# Patient Record
Sex: Female | Born: 1950
Health system: Southern US, Community
[De-identification: ages and names within clinical notes are randomized; demographics above are authoritative.]

## PROBLEM LIST (undated history)

## (undated) DIAGNOSIS — F419 Anxiety disorder, unspecified: Secondary | ICD-10-CM

## (undated) DIAGNOSIS — M1990 Unspecified osteoarthritis, unspecified site: Secondary | ICD-10-CM

## (undated) DIAGNOSIS — T8859XA Other complications of anesthesia, initial encounter: Secondary | ICD-10-CM

## (undated) DIAGNOSIS — F329 Major depressive disorder, single episode, unspecified: Secondary | ICD-10-CM

## (undated) DIAGNOSIS — R12 Heartburn: Secondary | ICD-10-CM

## (undated) DIAGNOSIS — D649 Anemia, unspecified: Secondary | ICD-10-CM

## (undated) DIAGNOSIS — K219 Gastro-esophageal reflux disease without esophagitis: Secondary | ICD-10-CM

## (undated) DIAGNOSIS — F32A Depression, unspecified: Secondary | ICD-10-CM

## (undated) DIAGNOSIS — C801 Malignant (primary) neoplasm, unspecified: Secondary | ICD-10-CM

## (undated) DIAGNOSIS — F988 Other specified behavioral and emotional disorders with onset usually occurring in childhood and adolescence: Secondary | ICD-10-CM

## (undated) DIAGNOSIS — E039 Hypothyroidism, unspecified: Secondary | ICD-10-CM

## (undated) HISTORY — DX: Hypothyroidism, unspecified: E03.9

## (undated) HISTORY — PX: VARICOSE VEIN SURGERY: SHX832

## (undated) HISTORY — DX: Other specified behavioral and emotional disorders with onset usually occurring in childhood and adolescence: F98.8

## (undated) HISTORY — PX: OTHER SURGICAL HISTORY: SHX169

---

## 2007-03-03 ENCOUNTER — Other Ambulatory Visit: Admission: RE | Admit: 2007-03-03 | Discharge: 2007-03-03 | Payer: Self-pay | Admitting: Obstetrics and Gynecology

## 2007-03-31 ENCOUNTER — Encounter: Admission: RE | Admit: 2007-03-31 | Discharge: 2007-03-31 | Payer: Self-pay | Admitting: Obstetrics and Gynecology

## 2007-04-06 ENCOUNTER — Ambulatory Visit: Payer: Self-pay | Admitting: Internal Medicine

## 2007-04-20 ENCOUNTER — Encounter: Payer: Self-pay | Admitting: Internal Medicine

## 2007-04-20 ENCOUNTER — Ambulatory Visit: Payer: Self-pay | Admitting: Internal Medicine

## 2008-03-27 ENCOUNTER — Other Ambulatory Visit: Admission: RE | Admit: 2008-03-27 | Discharge: 2008-03-27 | Payer: Self-pay | Admitting: Obstetrics and Gynecology

## 2009-04-06 ENCOUNTER — Encounter: Admission: RE | Admit: 2009-04-06 | Discharge: 2009-04-06 | Payer: Self-pay | Admitting: Obstetrics and Gynecology

## 2009-12-08 HISTORY — PX: KNEE ARTHROSCOPY: SUR90

## 2010-07-23 ENCOUNTER — Encounter: Admission: RE | Admit: 2010-07-23 | Discharge: 2010-07-23 | Payer: Self-pay | Admitting: Obstetrics and Gynecology

## 2011-08-20 ENCOUNTER — Other Ambulatory Visit: Payer: Self-pay | Admitting: Obstetrics and Gynecology

## 2011-08-20 DIAGNOSIS — Z1231 Encounter for screening mammogram for malignant neoplasm of breast: Secondary | ICD-10-CM

## 2011-08-25 ENCOUNTER — Ambulatory Visit
Admission: RE | Admit: 2011-08-25 | Discharge: 2011-08-25 | Disposition: A | Discharge status: Home / Self Care | Payer: PRIVATE HEALTH INSURANCE | Source: Ambulatory Visit | Attending: Obstetrics and Gynecology | Admitting: Obstetrics and Gynecology

## 2011-08-25 DIAGNOSIS — Z1231 Encounter for screening mammogram for malignant neoplasm of breast: Secondary | ICD-10-CM

## 2012-09-23 ENCOUNTER — Other Ambulatory Visit (INDEPENDENT_AMBULATORY_CARE_PROVIDER_SITE_OTHER): Payer: PRIVATE HEALTH INSURANCE

## 2012-09-23 DIAGNOSIS — Z23 Encounter for immunization: Secondary | ICD-10-CM

## 2013-06-09 ENCOUNTER — Ambulatory Visit (INDEPENDENT_AMBULATORY_CARE_PROVIDER_SITE_OTHER): Payer: BC Managed Care – PPO | Admitting: Family Medicine

## 2013-06-09 ENCOUNTER — Ambulatory Visit: Payer: Self-pay | Admitting: Family Medicine

## 2013-06-09 ENCOUNTER — Encounter: Payer: Self-pay | Admitting: Family Medicine

## 2013-06-09 VITALS — BP 94/60 | HR 80 | Temp 102.5°F | Ht 65.0 in | Wt 193.0 lb

## 2013-06-09 DIAGNOSIS — N390 Urinary tract infection, site not specified: Secondary | ICD-10-CM

## 2013-06-09 DIAGNOSIS — R6883 Chills (without fever): Secondary | ICD-10-CM

## 2013-06-09 DIAGNOSIS — R52 Pain, unspecified: Secondary | ICD-10-CM

## 2013-06-09 DIAGNOSIS — R509 Fever, unspecified: Secondary | ICD-10-CM

## 2013-06-09 LAB — POCT URINALYSIS DIPSTICK
Bilirubin, UA: NEGATIVE
Nitrite, UA: NEGATIVE
Spec Grav, UA: 1.01
pH, UA: 7

## 2013-06-09 MED ORDER — CIPROFLOXACIN HCL 500 MG PO TABS: 500.0000 mg | ORAL_TABLET | Freq: Two times a day (BID) | ORAL | Status: DC

## 2013-06-09 NOTE — Progress Notes (Signed)
Chief Complaint  Patient presents with  . Chills    and fever, joint paint that started yesterday.    Patient had acute onset of fever, generalized body aches, "my eyeballs hurt", "roots of my hair" hurt, starting yesterday.  She had chills, fever, decreased appetite. She denies other symptoms, although does reports using the bathroom a little more frequently.  Denies dysuria, urgency, urinary odor, hematuria.   She has been getting vein treatments--laser treatment 1 month ago, and injections last about 2 weeks ago.  Denies any known redness or pain.  Denies any tick bites  Past Medical History  Diagnosis Date  . Hypothyroidism   . ADD (attention deficit disorder)    History reviewed. No pertinent past surgical history. History   Social History  . Marital Status: Married    Spouse Name: N/A    Number of Children: N/A  . Years of Education: N/A   Occupational History  . Not on file.   Social History Main Topics  . Smoking status: Former Smoker    Quit date: 12/08/1990  . Smokeless tobacco: Not on file  . Alcohol Use: Yes     Comment: 1 week  . Drug Use: Not on file  . Sexually Active: Not on file   Other Topics Concern  . Not on file   Social History Narrative   Married.  Retired from [employer], now working at [employer]  Has 1 son in [place] (works for [employer]), daughter Selena Batten leaves in [place].  Originally from [place]   Family History  Problem Relation Age of Onset  . Arthritis Mother    Current outpatient prescriptions:amphetamine-dextroamphetamine (ADDERALL XR) 25 MG 24 hr capsule, Take 25 mg by mouth every morning., Disp: , Rfl: ;  calcium carbonate (OS-CAL) 600 MG TABS, Take 600 mg by mouth 2 (two) times daily with a meal., Disp: , Rfl: ;  cholecalciferol (VITAMIN D) 1000 UNITS tablet, Take 1,000 Units by mouth daily., Disp: , Rfl:  levothyroxine (SYNTHROID, LEVOTHROID) 25 MCG tablet, Take 25 mcg by mouth daily before breakfast., Disp: , Rfl: ;  Multiple  Vitamins-Minerals (MULTIVITAMIN WITH MINERALS) tablet, Take 1 tablet by mouth daily., Disp: , Rfl:   No Known Allergies  ROS:  Denies nausea, vomiting, diarrhea, URI symptoms or cough, no runny nose/congestion, ear pain.  +diffuse headache (mainly frontal and temples).  Denies abdominal pain, chest pain, shortness of breath, flank pain. No sick contacts. Denies skin rash, bleeding or other complaints except as per HPI  PHYSICAL EXAM: BP 94/60  Pulse 80  Temp(Src) 102.5 F (39.2 C) (Oral)  Ht 5\' 5"  (1.651 m)  Wt 193 lb (87.544 kg)  BMI 32.12 kg/m2 Mildly ill appearing female in no distress HEENT:  PERRL, EOMI, conjunctiva clear.  TM's and EACs normal.  OP clear without erythema.  Sinuses nontender.  No temporal artery tenderness. Neck: no lymphadenopathy, thyromegaly or mass Heart: 2/6 SEM loudest at RUSB, hyperdynamic, pulse 92 Lungs: clear bilaterally Abdomen: soft, nontender, no organomegaly or mass, no suprapubic tenderness Back: no spine or CVA tenderness Skin: R thigh--medially there is a slightly firm vein with slight bruising.  No erythema, warmth or fluctuance (area of recent injection). Extremities: no edema Neuro: alert and oriented, normal gait, strength, sensation Psych: normal mood, affect, hygiene and grooming  Urine 2+ leuks, 2+ blood  ASSESSMENT/PLAN:  Chills - Plan: Influenza A/B, POCT Urinalysis Dipstick  Body aches - Plan: Influenza A/B, POCT Urinalysis Dipstick  Fever - Plan: Influenza A/B, POCT Urinalysis  Dipstick, Urine culture  Urinary tract infection, site not specified - Plan: Urine culture, ciprofloxacin (CIPRO) 500 MG tablet

## 2013-06-09 NOTE — Patient Instructions (Addendum)
Drink plenty of fluids.  Take antibiotics twice daily for a week. Use tylenol and/or ibuprofen for control of fever.  You should feel significantly better after 48 hours of antibiotics.  Call us Monday if you are still feeling bad.  We should have culture results in about 3 days.

## 2013-06-12 LAB — URINE CULTURE

## 2013-06-13 ENCOUNTER — Encounter: Payer: Self-pay | Admitting: Family Medicine

## 2013-06-13 ENCOUNTER — Other Ambulatory Visit: Payer: Self-pay

## 2013-06-13 NOTE — Telephone Encounter (Signed)
UPDATED MED LIST PER PT

## 2013-08-17 ENCOUNTER — Other Ambulatory Visit (INDEPENDENT_AMBULATORY_CARE_PROVIDER_SITE_OTHER): Payer: BC Managed Care – PPO

## 2013-08-17 DIAGNOSIS — Z23 Encounter for immunization: Secondary | ICD-10-CM

## 2013-11-07 ENCOUNTER — Ambulatory Visit (INDEPENDENT_AMBULATORY_CARE_PROVIDER_SITE_OTHER): Payer: BC Managed Care – PPO | Admitting: Podiatry

## 2013-11-07 ENCOUNTER — Encounter: Payer: Self-pay | Admitting: Podiatry

## 2013-11-07 VITALS — BP 107/77 | HR 70 | Resp 16

## 2013-11-07 DIAGNOSIS — M2010 Hallux valgus (acquired), unspecified foot: Secondary | ICD-10-CM

## 2013-11-07 DIAGNOSIS — M2040 Other hammer toe(s) (acquired), unspecified foot: Secondary | ICD-10-CM

## 2013-11-07 DIAGNOSIS — M779 Enthesopathy, unspecified: Secondary | ICD-10-CM

## 2013-11-07 NOTE — Progress Notes (Signed)
Subjective:     Patient ID: Sarah James, female   DOB: December 09, 1950, 63 y.o.   MRN: [id number]  HPI patient presents to pick up orthotics and states I want to get my feet fixed but I think I need to wait till January because I am going on a cruise at the end of the month. Patient has structural bunion deformity hammertoe deformity of the lesser digits and Taylor's bunion deformity left over right foot   Review of Systems     Objective:   Physical Exam    neurovascular status intact with structural for deformity of both feet with bunions hammertoes and tailor bunion deformity noted Assessment:    and Taylor's bunion deformity noted left over right foot. As forefoot inflammation and toes are elongated Chronic structural malalignment of the forefeet bilateral with capsulitis and inflammation    Plan:     Reviewed conditions and discussed correction. I did discuss that when fixing the toes we would shorten them which would make him more stable and more comfortable in shoe gear and reduce pressure against the metatarsal joints. Patient wants surgery but needs to wait to beginning of January and we will have her back third week of December to discuss in greater detail what will be necessary. Orthotics dispensed today

## 2013-11-07 NOTE — Patient Instructions (Signed)

## 2013-12-19 ENCOUNTER — Ambulatory Visit: Payer: BC Managed Care – PPO | Admitting: Podiatry

## 2014-02-23 ENCOUNTER — Encounter: Payer: Self-pay | Admitting: Internal Medicine

## 2014-12-12 ENCOUNTER — Other Ambulatory Visit: Payer: Self-pay

## 2014-12-12 DIAGNOSIS — Z1231 Encounter for screening mammogram for malignant neoplasm of breast: Secondary | ICD-10-CM

## 2014-12-13 ENCOUNTER — Telehealth: Payer: Self-pay | Admitting: Certified Nurse Midwife

## 2014-12-13 NOTE — Telephone Encounter (Signed)
Spoke with patient. Advised patient of message as seen below from Sarah James. Patient is agreeable and would like to move aex appointment up at this time. Aex moved to 1/18 at 2pm with Regina Eck CNM. Patient is agreeable to date and time.  Cc: Dr.Silva  Routing to provider for final review. Patient agreeable to disposition. Will close encounter

## 2014-12-13 NOTE — Telephone Encounter (Signed)
Patient has her next aex appointment 01/25/15 with Sarah James. Patient was last seen 09/16/12 and is wondering is Sarah James would send an order for her MMG. Patient called the [facility] center and was told she would need to see her GYN because it had been so long since her last MMG.

## 2014-12-13 NOTE — Telephone Encounter (Signed)
[facility] was to have routine screening in one year. Results available in EPIC. Patient was last seen for aex in our office 09/16/12. Has annual scheduled for 01/25/15. Spoke with the [facility] regarding scheduling for screening mammogram and was advised will need to see GYN for order. Patient would like to know if order can be placed before aex so that she can schedule screening.   Dr.Silva please advise as Regina Eck CNM and Milford Cage, FNP are out of the office today.

## 2014-12-13 NOTE — Telephone Encounter (Signed)
I recommend that the patient is seen first for her annual exam. She has not been examined by our office in over 2 years.  If there is an opening for an annual exam sooner, this may expedite the mammogram.

## 2014-12-25 ENCOUNTER — Ambulatory Visit (INDEPENDENT_AMBULATORY_CARE_PROVIDER_SITE_OTHER): Payer: BLUE CROSS/BLUE SHIELD | Admitting: Certified Nurse Midwife

## 2014-12-25 ENCOUNTER — Encounter: Payer: Self-pay | Admitting: Certified Nurse Midwife

## 2014-12-25 VITALS — BP 102/64 | HR 68 | Resp 16 | Ht 64.25 in | Wt 195.0 lb

## 2014-12-25 DIAGNOSIS — Z01419 Encounter for gynecological examination (general) (routine) without abnormal findings: Secondary | ICD-10-CM

## 2014-12-25 DIAGNOSIS — Z124 Encounter for screening for malignant neoplasm of cervix: Secondary | ICD-10-CM

## 2014-12-25 NOTE — Patient Instructions (Signed)

## 2014-12-25 NOTE — Progress Notes (Signed)
64 y.o. G26P2004 Married  Caucasian Fe here for annual exam. Menopausal no HRT. Patient denies vaginal bleeding or vaginal dryness.  Busy caring for 32 year old mother, so has not kept up with exam. Denies any problems, has appointment scheduled with PCP and has all labs with spouse's work. Went to vein clinic and had varicosities treated, feels so much better. Appreciative to being discussed at last visit. No other health issues today.  Patient's last menstrual period was 12/08/2002.          Sexually active: Yes.    The current method of family planning is post menopausal status.    Exercising: Yes.    tennis Smoker:  no  Health Maintenance: Pap: 09-16-12 neg HPV neg MMG:  08-20-11 birads 1:neg Colonoscopy:  2008 BMD:   2010 TDaP:  2014 Labs: none Self breast exam: done occ   reports that she quit smoking about 24 years ago. She does not have any smokeless tobacco history on file. She reports that she drinks alcohol. She reports that she does not use illicit drugs.  Past Medical History  Diagnosis Date  . Hypothyroidism   . ADD (attention deficit disorder)     Past Surgical History  Procedure Laterality Date  . Deviated septum repair    . Varicose vein surgery    . Knee arthroscopy Right 2011    Current Outpatient Prescriptions  Medication Sig Dispense Refill  . amphetamine-dextroamphetamine (ADDERALL XR) 25 MG 24 hr capsule Take 25 mg by mouth every morning.    . calcium carbonate (OS-CAL) 600 MG TABS Take 600 mg by mouth daily.     Marland Kitchen FLUoxetine (PROZAC) 10 MG capsule Take 10 mg by mouth daily.    Marland Kitchen levothyroxine (SYNTHROID, LEVOTHROID) 88 MCG tablet Take 88 mcg by mouth daily before breakfast.    . Multiple Vitamins-Minerals (MULTIVITAMIN WITH MINERALS) tablet Take 1 tablet by mouth daily.     No current facility-administered medications for this visit.    Family History  Problem Relation Age of Onset  . Arthritis Mother     ROS:  Pertinent items are noted in HPI.   Otherwise, a comprehensive ROS was negative.  Exam:   BP 102/64 mmHg  Pulse 68  Resp 16  Ht 5' 4.25" (1.632 m)  Wt 195 lb (88.451 kg)  BMI 33.21 kg/m2  LMP 12/08/2002 Height: 5' 4.25" (163.2 cm) Ht Readings from Last 3 Encounters:  12/25/14 5' 4.25" (1.632 m)  06/09/13 5\' 5"  (1.651 m)    General appearance: alert, cooperative and appears stated age Head: Normocephalic, without obvious abnormality, atraumatic Neck: no adenopathy, supple, symmetrical, trachea midline and thyroid normal to inspection and palpation Lungs: clear to auscultation bilaterally Breasts: normal appearance, no masses or tenderness, No nipple retraction or dimpling, No nipple discharge or bleeding, No axillary or supraclavicular adenopathy Heart: regular rate and rhythm Abdomen: soft, non-tender; no masses,  no organomegaly Extremities: extremities normal, atraumatic, no cyanosis or edema Skin: Skin color, texture, turgor normal. No rashes or lesions Lymph nodes: Cervical, supraclavicular, and axillary nodes normal. No abnormal inguinal nodes palpated Neurologic: Grossly normal   Pelvic: External genitalia:  no lesions              Urethra:  normal appearing urethra with no masses, tenderness or lesions              Bartholin's and Skene's: normal                 Vagina: normal  appearing vagina with normal color and discharge, no lesions              Cervix: normal appearance,  non tender, no lesions              Pap taken: Yes.   Bimanual Exam:  Uterus:  normal size, contour, position, consistency, mobility, non-tender              Adnexa: normal adnexa               Rectovaginal: Confirms               Anus:  normal sphincter tone, no lesions  Chaperone present: /NO  A:  Well Woman with normal exam   Menopausal no HRT  Caregiver for mother with social stress  Mammogram and BMD due patient plans to schedule  P:   Reviewed health and wellness pertinent to exam  Aware of need to evaluate if vaginal  bleeding  Discussed family and friend support and time for self  Pap smear taken today with HPV reflex   counseled on breast self exam, mammography screening, menopause, adequate intake of calcium and vitamin D, diet and exercise  return annually or prn  An After Visit Summary was printed and given to the patient.

## 2014-12-26 ENCOUNTER — Other Ambulatory Visit: Payer: Self-pay | Admitting: Certified Nurse Midwife

## 2014-12-26 ENCOUNTER — Other Ambulatory Visit: Payer: Self-pay

## 2014-12-26 DIAGNOSIS — Z1231 Encounter for screening mammogram for malignant neoplasm of breast: Secondary | ICD-10-CM

## 2014-12-26 NOTE — Progress Notes (Signed)
Reviewed personally.  M. Suzanne Artia Singley, MD.  

## 2014-12-27 ENCOUNTER — Telehealth: Payer: Self-pay | Admitting: Certified Nurse Midwife

## 2014-12-27 DIAGNOSIS — Z780 Asymptomatic menopausal state: Secondary | ICD-10-CM

## 2014-12-27 DIAGNOSIS — E2839 Other primary ovarian failure: Secondary | ICD-10-CM

## 2014-12-27 LAB — IPS PAP TEST WITH REFLEX TO HPV

## 2014-12-27 NOTE — Telephone Encounter (Signed)
Pt tried to make appointment for her mammogram and bone density at [facility]. Pt states she wants to make them for the same day and is not able to unless she has the order sent over for the bone density. [facility] said we can send electronically. Pt is anxious to make appointments asap.  Pt requests call when orders sent.

## 2014-12-27 NOTE — Telephone Encounter (Signed)
Spoke with patient. Advised patient order for BMD has been sent over to [facility]. Patient is agreeable and will call to schedule appointment at this time. Patient is very grateful for all of our help.  Routing to provider for final review. Patient agreeable to disposition. Will close encounter

## 2015-01-02 ENCOUNTER — Emergency Department (HOSPITAL_COMMUNITY)
Admission: EM | Admit: 2015-01-02 | Discharge: 2015-01-02 | Disposition: A | Discharge status: Home / Self Care | Payer: BLUE CROSS/BLUE SHIELD | Attending: Emergency Medicine | Admitting: Emergency Medicine

## 2015-01-02 ENCOUNTER — Emergency Department (HOSPITAL_COMMUNITY): Payer: BLUE CROSS/BLUE SHIELD

## 2015-01-02 ENCOUNTER — Encounter (HOSPITAL_COMMUNITY): Payer: Self-pay | Admitting: Emergency Medicine

## 2015-01-02 DIAGNOSIS — Z87891 Personal history of nicotine dependence: Secondary | ICD-10-CM | POA: Diagnosis not present

## 2015-01-02 DIAGNOSIS — Y9389 Activity, other specified: Secondary | ICD-10-CM | POA: Diagnosis not present

## 2015-01-02 DIAGNOSIS — Z79899 Other long term (current) drug therapy: Secondary | ICD-10-CM | POA: Insufficient documentation

## 2015-01-02 DIAGNOSIS — Y9289 Other specified places as the place of occurrence of the external cause: Secondary | ICD-10-CM | POA: Diagnosis not present

## 2015-01-02 DIAGNOSIS — W01198A Fall on same level from slipping, tripping and stumbling with subsequent striking against other object, initial encounter: Secondary | ICD-10-CM | POA: Insufficient documentation

## 2015-01-02 DIAGNOSIS — S0990XA Unspecified injury of head, initial encounter: Secondary | ICD-10-CM | POA: Insufficient documentation

## 2015-01-02 DIAGNOSIS — Z8659 Personal history of other mental and behavioral disorders: Secondary | ICD-10-CM | POA: Diagnosis not present

## 2015-01-02 DIAGNOSIS — Y998 Other external cause status: Secondary | ICD-10-CM | POA: Insufficient documentation

## 2015-01-02 DIAGNOSIS — E039 Hypothyroidism, unspecified: Secondary | ICD-10-CM | POA: Insufficient documentation

## 2015-01-02 DIAGNOSIS — W19XXXA Unspecified fall, initial encounter: Secondary | ICD-10-CM

## 2015-01-02 DIAGNOSIS — S0003XA Contusion of scalp, initial encounter: Secondary | ICD-10-CM | POA: Diagnosis not present

## 2015-01-02 MED ORDER — HYDROCODONE-ACETAMINOPHEN 5-325 MG PO TABS: 1.0000 | ORAL_TABLET | Freq: Four times a day (QID) | ORAL | Status: DC | PRN

## 2015-01-02 NOTE — ED Provider Notes (Signed)
CSN: [id number]     Arrival date & time 01/02/15  1345 History   First MD Initiated Contact with Patient 01/02/15 1605     Chief Complaint  Patient presents with  . Fall     (Consider location/radiation/quality/duration/timing/severity/associated sxs/prior Treatment) Patient is a 64 y.o. female presenting with fall. The history is provided by the patient.  Fall This is a new problem. Episode onset: 2-3 hrs ago. Episode frequency: once. The problem has been resolved. Pertinent negatives include no chest pain, no abdominal pain, no headaches and no shortness of breath. Nothing aggravates the symptoms. The symptoms are relieved by medications. Treatments tried: took 2 advil prior to fall. The treatment provided moderate relief.    Past Medical History  Diagnosis Date  . Hypothyroidism   . ADD (attention deficit disorder)    Past Surgical History  Procedure Laterality Date  . Deviated septum repair    . Varicose vein surgery    . Knee arthroscopy Right 2011   Family History  Problem Relation Age of Onset  . Arthritis Mother   . Hypertension Mother   . Lung cancer Father   . Stroke Maternal Grandmother   . Hypertension Maternal Grandmother   . Heart disease Paternal Grandfather    History  Substance Use Topics  . Smoking status: Former Smoker    Quit date: 12/08/1990  . Smokeless tobacco: Not on file  . Alcohol Use: 0.0 oz/week    0 Not specified per week     Comment: 1 or 2 a month   OB History    Gravida Para Term Preterm AB TAB SAB Ectopic Multiple Living   2 2 2       4      Review of Systems  Constitutional: Negative for fever and fatigue.  HENT: Negative for congestion and drooling.   Eyes: Negative for pain.  Respiratory: Negative for cough and shortness of breath.   Cardiovascular: Negative for chest pain.  Gastrointestinal: Negative for nausea, vomiting, abdominal pain and diarrhea.  Genitourinary: Negative for dysuria and hematuria.  Musculoskeletal:  Negative for back pain, gait problem and neck pain.  Skin: Negative for color change.  Neurological: Negative for dizziness and headaches.  Hematological: Negative for adenopathy.  Psychiatric/Behavioral: Negative for behavioral problems.  All other systems reviewed and are negative.     Allergies  Review of patient's allergies indicates no known allergies.  Home Medications   Prior to Admission medications   Medication Sig Start Date End Date Taking? Authorizing Provider  amphetamine-dextroamphetamine (ADDERALL XR) 25 MG 24 hr capsule Take 25 mg by mouth every morning.    Historical Provider, MD  calcium carbonate (OS-CAL) 600 MG TABS Take 600 mg by mouth daily.     Historical Provider, MD  FLUoxetine (PROZAC) 10 MG capsule Take 10 mg by mouth daily.    Historical Provider, MD  levothyroxine (SYNTHROID, LEVOTHROID) 88 MCG tablet Take 88 mcg by mouth daily before breakfast.    Historical Provider, MD  Multiple Vitamins-Minerals (MULTIVITAMIN WITH MINERALS) tablet Take 1 tablet by mouth daily.    Historical Provider, MD   BP 125/77 mmHg  Pulse 73  Temp(Src) 98.2 F (36.8 C) (Oral)  Resp 20  SpO2 100%  LMP 12/08/2002 Physical Exam  Constitutional: She is oriented to person, place, and time. She appears well-developed and well-nourished.  HENT:  Mouth/Throat: Oropharynx is clear and moist. No oropharyngeal exudate.  Mild swelling to the right exterior parietal area.  Eyes: Conjunctivae and EOM are normal.  Pupils are equal, round, and reactive to light.  Neck: Normal range of motion. Neck supple.  No vertebral tenderness to palpation. Mild right paracervical tenderness to palpation.  Cardiovascular: Normal rate, regular rhythm, normal heart sounds and intact distal pulses.  Exam reveals no gallop and no friction rub.   No murmur heard. Pulmonary/Chest: Effort normal and breath sounds normal. No respiratory distress. She has no wheezes.  Abdominal: Soft. Bowel sounds are normal.  There is no tenderness. There is no rebound and no guarding.  Musculoskeletal: Normal range of motion. She exhibits no edema or tenderness.  Neurological: She is alert and oriented to person, place, and time.  alert, oriented x3 speech: normal in context and clarity memory: intact grossly cranial nerves II-XII: intact motor strength: full proximally and distally no involuntary movements or tremors sensation: intact to light touch diffusely  cerebellar: finger-to-nose and heel-to-shin intact gait: normal gait  Skin: Skin is warm and dry.  Psychiatric: She has a normal mood and affect. Her behavior is normal.  Nursing note and vitals reviewed.   ED Course  Procedures (including critical care time) Labs Review Labs Reviewed - No data to display  Imaging Review Ct Head Wo Contrast  01/02/2015   CLINICAL DATA:  Status post fall. Hit the back of the head. No loss of consciousness.  EXAM: CT HEAD WITHOUT CONTRAST  TECHNIQUE: Contiguous axial images were obtained from the base of the skull through the vertex without intravenous contrast.  COMPARISON:  None.  FINDINGS: There is no evidence of mass effect, midline shift or extra-axial fluid collections. There is no evidence of a space-occupying lesion or intracranial hemorrhage. There is no evidence of a cortical-based area of acute infarction.  The ventricles and sulci are appropriate for the patient's age. The basal cisterns are patent.  Visualized portions of the orbits are unremarkable. The visualized portions of the paranasal sinuses and mastoid air cells are unremarkable.  The osseous structures are unremarkable. Small posterior scalp hematoma.  IMPRESSION: Normal CT of the brain without intravenous contrast.   Electronically Signed   By: Kathreen Devoid   On: 01/02/2015 15:30     EKG Interpretation None      MDM   Final diagnoses:  Fall, initial encounter  Head injury, initial encounter    4:32 PM 64 y.o. female who presents with a  mechanical fall which occurred several hours ago. The patient was walking out of her garage when she slipped on ice falling backwards hitting the right parietal area of her head on cement. She denies loss of consciousness. She complains of 4/10 right-sided headache and right lateral neck pain. She also has some mild right upper arm pain. She is afebrile and vital signs are unremarkable here. She has a normal neurologic exam. Her gait has been normal.screening CT of her head performed prior to my evaluation which is noncontributory. She continues to appear well on exam.  4:33 PM:  I have discussed the diagnosis/risks/treatment options with the patient and believe the pt to be eligible for discharge home to follow-up with her pcp as needed. Doubt concussion as patient did not lose consciousness but gave her return precautions for this anyway. We also discussed returning to the ED immediately if new or worsening sx occur. We discussed the sx which are most concerning (e.g., worsening HA, AMS, altered gait) that necessitate immediate return. Medications administered to the patient during their visit and any new prescriptions provided to the patient are listed below.  Medications given  during this visit Medications - No data to display  New Prescriptions   HYDROCODONE-ACETAMINOPHEN (NORCO) 5-325 MG PER TABLET    Take 1 tablet by mouth every 6 (six) hours as needed for moderate pain.       Pamella Pert, MD 01/02/15 [phone]

## 2015-01-02 NOTE — Discharge Instructions (Signed)
Concussion A concussion is a brain injury. It is caused by:  A hit to the head.  A quick and sudden movement (jolt) of the head or neck. A concussion is usually not life threatening. Even so, it can cause serious problems. If you had a concussion before, you may have concussion-like problems after a hit to your head. HOME CARE General Instructions  Follow your doctor's directions carefully.  Take medicines only as told by your doctor.  Only take medicines your doctor says are safe.  Do not drink alcohol until your doctor says it is okay. Alcohol and some drugs can slow down healing. They can also put you at risk for further injury.  If you are having trouble remembering things, write them down.  Try to do one thing at a time if you get distracted easily. For example, do not watch TV while making dinner.  Talk to your family members or close friends when making important decisions.  Follow up with your doctor as told.  Watch your symptoms. Tell others to do the same. Serious problems can sometimes happen after a concussion. Older adults are more likely to have these problems.  Tell your teachers, school nurse, school counselor, coach, Product/process development scientist, or work Freight forwarder about your concussion. Tell them about what you can or cannot do. They should watch to see if:  It gets even harder for you to pay attention or concentrate.  It gets even harder for you to remember things or learn new things.  You need more time than normal to finish things.  You become annoyed (irritable) more than before.  You are not able to deal with stress as well.  You have more problems than before.  Rest. Make sure you:  Get plenty of sleep at night.  Go to sleep early.  Go to bed at the same time every day. Try to wake up at the same time.  Rest during the day.  Take naps when you feel tired.  Limit activities where you have to think a lot or concentrate. These include:  Doing  homework.  Doing work related to a job.  Watching TV.  Using the computer. Returning To Your Regular Activities Return to your normal activities slowly, not all at once. You must give your body and brain enough time to heal.   Do not play sports or do other athletic activities until your doctor says it is okay.  Ask your doctor when you can drive, ride a bicycle, or work other vehicles or machines. Never do these things if you feel dizzy.  Ask your doctor about when you can return to work or school. Preventing Another Concussion It is very important to avoid another brain injury, especially before you have healed. In rare cases, another injury can lead to permanent brain damage, brain swelling, or death. The risk of this is greatest during the first 7-10 days after your injury. Avoid injuries by:   Wearing a seat belt when riding in a car.  Not drinking too much alcohol.  Avoiding activities that could lead to a second concussion (such as contact sports).  Wearing a helmet when doing activities like:  Biking.  Skiing.  Skateboarding.  Skating.  Making your home safer by:  Removing things from the floor or stairways that could make you trip.  Using grab bars in bathrooms and handrails by stairs.  Placing non-slip mats on floors and in bathtubs.  Improve lighting in dark areas. GET HELP IF:  It  gets even harder for you to pay attention or concentrate.  It gets even harder for you to remember things or learn new things.  You need more time than normal to finish things.  You become annoyed (irritable) more than before.  You are not able to deal with stress as well.  You have more problems than before.  You have problems keeping your balance.  You are not able to react quickly when you should. Get help if you have any of these problems for more than 2 weeks:   Lasting (chronic) headaches.  Dizziness or trouble balancing.  Feeling sick to your stomach  (nausea).  Seeing (vision) problems.  Being affected by noises or light more than normal.  Feeling sad, low, down in the dumps, blue, gloomy, or empty (depressed).  Mood changes (mood swings).  Feeling of fear or nervousness about what may happen (anxiety).  Feeling annoyed.  Memory problems.  Problems concentrating or paying attention.  Sleep problems.  Feeling tired all the time. GET HELP RIGHT AWAY IF:   You have bad headaches or your headaches get worse.  You have weakness (even if it is in one hand, leg, or part of the face).  You have loss of feeling (numbness).  You feel off balance.  You keep throwing up (vomiting).  You feel tired.  One black center of your eye (pupil) is larger than the other.  You twitch or shake violently (convulse).  Your speech is not clear (slurred).  You are more confused, easily angered (agitated), or annoyed than before.  You have more trouble resting than before.  You are unable to recognize people or places.  You have neck pain.  It is difficult to wake you up.  You have unusual behavior changes.  You pass out (lose consciousness). MAKE SURE YOU:   Understand these instructions.  Will watch your condition.  Will get help right away if you are not doing well or get worse. Document Released: 11/12/2009 Document Revised: 04/10/2014 Document Reviewed: 06/16/2013 [facility] Patient Information 2015 [place], [place]. This information is not intended to replace advice given to you by your health care provider. Make sure you discuss any questions you have with your health care provider.

## 2015-01-02 NOTE — ED Notes (Signed)
Pt c/o trip and fall today hitting head on floor; small hematoma noted; pt denies LOC or blood thinners

## 2015-01-05 ENCOUNTER — Ambulatory Visit
Admission: RE | Admit: 2015-01-05 | Discharge: 2015-01-05 | Disposition: A | Discharge status: Home / Self Care | Payer: BLUE CROSS/BLUE SHIELD | Source: Ambulatory Visit | Attending: Certified Nurse Midwife | Admitting: Certified Nurse Midwife

## 2015-01-05 ENCOUNTER — Ambulatory Visit
Admission: RE | Admit: 2015-01-05 | Discharge: 2015-01-05 | Disposition: A | Discharge status: Home / Self Care | Payer: BLUE CROSS/BLUE SHIELD | Source: Ambulatory Visit

## 2015-01-05 DIAGNOSIS — Z780 Asymptomatic menopausal state: Secondary | ICD-10-CM

## 2015-01-05 DIAGNOSIS — E2839 Other primary ovarian failure: Secondary | ICD-10-CM

## 2015-01-05 DIAGNOSIS — Z1231 Encounter for screening mammogram for malignant neoplasm of breast: Secondary | ICD-10-CM

## 2015-01-25 ENCOUNTER — Ambulatory Visit: Payer: Self-pay | Admitting: Certified Nurse Midwife

## 2015-02-28 ENCOUNTER — Other Ambulatory Visit: Payer: Self-pay | Admitting: Dermatology

## 2015-03-22 ENCOUNTER — Encounter: Payer: Self-pay | Admitting: Internal Medicine

## 2015-06-04 ENCOUNTER — Other Ambulatory Visit: Payer: Self-pay

## 2015-07-27 ENCOUNTER — Encounter: Payer: Self-pay | Admitting: Podiatry

## 2015-07-27 ENCOUNTER — Ambulatory Visit (INDEPENDENT_AMBULATORY_CARE_PROVIDER_SITE_OTHER): Payer: BLUE CROSS/BLUE SHIELD

## 2015-07-27 ENCOUNTER — Ambulatory Visit (INDEPENDENT_AMBULATORY_CARE_PROVIDER_SITE_OTHER): Payer: BLUE CROSS/BLUE SHIELD | Admitting: Podiatry

## 2015-07-27 VITALS — BP 121/86 | HR 69 | Resp 12

## 2015-07-27 DIAGNOSIS — M779 Enthesopathy, unspecified: Secondary | ICD-10-CM | POA: Diagnosis not present

## 2015-07-27 DIAGNOSIS — M722 Plantar fascial fibromatosis: Secondary | ICD-10-CM

## 2015-07-27 DIAGNOSIS — R52 Pain, unspecified: Secondary | ICD-10-CM | POA: Diagnosis not present

## 2015-07-27 MED ORDER — MELOXICAM 7.5 MG PO TABS: 7.5000 mg | ORAL_TABLET | Freq: Every day | ORAL | Status: DC

## 2015-07-27 NOTE — Progress Notes (Signed)
   Subjective:    Patient ID: Sarah James, female    DOB: 07/03/1951, 64 y.o.   MRN: [id number]  HPI  64 year old female presents the office today with concerns of left foot pain on the inside aspect of her foot behind her big toe joint on the bottom which his been ongoing the last several weeks. She relates that she did something while playing tennis. She does not recall specific incident which started the symptoms. She denies any swelling or redness. No tenderness. She said no prior treatment. The pain does not wake up at night. No other complaints at this time   Review of Systems  All other systems reviewed and are negative.      Objective:   Physical Exam AAO x3, NAD DP/PT pulses palpable bilaterally, CRT less than 3 seconds Protective sensation intact with Simms Weinstein monofilament, vibratory sensation intact, Achilles tendon reflex intact There is tenderness palpation along the medial aspect of the left foot just proximal to the sesamoid complex along the medial band of the plantar fascia. There is no pain on the course of plantar fascial in the arch of the foot on the insertion calcaneus. The plantar fascia appears intact. There is no pain with first MTPJ range of motion. No other areas of tenderness to bilateral lower extremities. MMT 5/5, ROM WNL.  No open lesions or pre-ulcerative lesions.  No overlying edema, erythema, increase in warmth to bilateral lower extremities.  No pain with calf compression, swelling, warmth, erythema bilaterally.      Assessment & Plan:  64 year old female presents with left the pain likely tendinitis/distal plantar fasciitis -X-rays were obtained and reviewed with the patient.  -Treatment options discussed including all alternatives, risks, and complications -I discussed the treatment options. Also discussed a steroid injection for which he elected to proceed with. Discussed risks, occasions and she rarely consents. Sterile conditions a total of  1.5 mL of a mixture of Kenalog 10, 0.5% Marcaine plain and 2% lidocaine plain was infiltrated into the area of maximal tenderness just proximal to the sesamoid complex the medial aspect of the foot. She tolerated the injection well any competitions. Postinjection care discussed the patient. -Discussed supportive shoe gear and orthotics. -Ice to the area daily -Stretching exercises -Prescribed mobic. Discussed side effects of the medication and directed to stop if any are to occur and call the office.  -Follow-up 2-3 weeks or sooner if any problems arise. In the meantime, encouraged to call the office with any questions, concerns, change in symptoms.   Celesta Gentile, DPM

## 2015-09-10 DIAGNOSIS — M9902 Segmental and somatic dysfunction of thoracic region: Secondary | ICD-10-CM | POA: Diagnosis not present

## 2015-09-10 DIAGNOSIS — E119 Type 2 diabetes mellitus without complications: Secondary | ICD-10-CM | POA: Diagnosis not present

## 2015-09-10 DIAGNOSIS — M7062 Trochanteric bursitis, left hip: Secondary | ICD-10-CM | POA: Diagnosis not present

## 2015-09-10 DIAGNOSIS — M7742 Metatarsalgia, left foot: Secondary | ICD-10-CM | POA: Diagnosis not present

## 2015-09-10 DIAGNOSIS — Z86010 Personal history of colonic polyps: Secondary | ICD-10-CM | POA: Diagnosis not present

## 2015-09-10 DIAGNOSIS — M2041 Other hammer toe(s) (acquired), right foot: Secondary | ICD-10-CM | POA: Diagnosis not present

## 2015-09-10 DIAGNOSIS — K625 Hemorrhage of anus and rectum: Secondary | ICD-10-CM | POA: Diagnosis not present

## 2015-09-10 DIAGNOSIS — M9906 Segmental and somatic dysfunction of lower extremity: Secondary | ICD-10-CM | POA: Diagnosis not present

## 2015-09-10 DIAGNOSIS — M9903 Segmental and somatic dysfunction of lumbar region: Secondary | ICD-10-CM | POA: Diagnosis not present

## 2015-09-10 DIAGNOSIS — M9905 Segmental and somatic dysfunction of pelvic region: Secondary | ICD-10-CM | POA: Diagnosis not present

## 2015-09-10 DIAGNOSIS — M2042 Other hammer toe(s) (acquired), left foot: Secondary | ICD-10-CM | POA: Diagnosis not present

## 2015-09-14 DIAGNOSIS — M9902 Segmental and somatic dysfunction of thoracic region: Secondary | ICD-10-CM | POA: Diagnosis not present

## 2015-09-14 DIAGNOSIS — M9903 Segmental and somatic dysfunction of lumbar region: Secondary | ICD-10-CM | POA: Diagnosis not present

## 2015-09-14 DIAGNOSIS — M2041 Other hammer toe(s) (acquired), right foot: Secondary | ICD-10-CM | POA: Diagnosis not present

## 2015-09-14 DIAGNOSIS — M9905 Segmental and somatic dysfunction of pelvic region: Secondary | ICD-10-CM | POA: Diagnosis not present

## 2015-09-14 DIAGNOSIS — M7062 Trochanteric bursitis, left hip: Secondary | ICD-10-CM | POA: Diagnosis not present

## 2015-09-14 DIAGNOSIS — M7742 Metatarsalgia, left foot: Secondary | ICD-10-CM | POA: Diagnosis not present

## 2015-09-14 DIAGNOSIS — M2042 Other hammer toe(s) (acquired), left foot: Secondary | ICD-10-CM | POA: Diagnosis not present

## 2015-09-14 DIAGNOSIS — M9906 Segmental and somatic dysfunction of lower extremity: Secondary | ICD-10-CM | POA: Diagnosis not present

## 2015-09-28 DIAGNOSIS — M7062 Trochanteric bursitis, left hip: Secondary | ICD-10-CM | POA: Diagnosis not present

## 2015-09-28 DIAGNOSIS — M9905 Segmental and somatic dysfunction of pelvic region: Secondary | ICD-10-CM | POA: Diagnosis not present

## 2015-09-28 DIAGNOSIS — M791 Myalgia: Secondary | ICD-10-CM | POA: Diagnosis not present

## 2015-09-28 DIAGNOSIS — M9903 Segmental and somatic dysfunction of lumbar region: Secondary | ICD-10-CM | POA: Diagnosis not present

## 2015-09-28 DIAGNOSIS — M9902 Segmental and somatic dysfunction of thoracic region: Secondary | ICD-10-CM | POA: Diagnosis not present

## 2015-10-05 DIAGNOSIS — J019 Acute sinusitis, unspecified: Secondary | ICD-10-CM | POA: Diagnosis not present

## 2015-10-16 DIAGNOSIS — Z1211 Encounter for screening for malignant neoplasm of colon: Secondary | ICD-10-CM | POA: Diagnosis not present

## 2015-10-16 DIAGNOSIS — K5730 Diverticulosis of large intestine without perforation or abscess without bleeding: Secondary | ICD-10-CM | POA: Diagnosis not present

## 2015-10-16 DIAGNOSIS — K635 Polyp of colon: Secondary | ICD-10-CM | POA: Diagnosis not present

## 2015-10-16 DIAGNOSIS — D124 Benign neoplasm of descending colon: Secondary | ICD-10-CM | POA: Diagnosis not present

## 2015-10-16 DIAGNOSIS — Z86010 Personal history of colonic polyps: Secondary | ICD-10-CM | POA: Diagnosis not present

## 2015-10-16 DIAGNOSIS — D125 Benign neoplasm of sigmoid colon: Secondary | ICD-10-CM | POA: Diagnosis not present

## 2015-10-17 DIAGNOSIS — H1851 Endothelial corneal dystrophy: Secondary | ICD-10-CM | POA: Diagnosis not present

## 2015-10-17 DIAGNOSIS — H2513 Age-related nuclear cataract, bilateral: Secondary | ICD-10-CM | POA: Diagnosis not present

## 2015-10-22 DIAGNOSIS — M9902 Segmental and somatic dysfunction of thoracic region: Secondary | ICD-10-CM | POA: Diagnosis not present

## 2015-10-22 DIAGNOSIS — M791 Myalgia: Secondary | ICD-10-CM | POA: Diagnosis not present

## 2015-10-22 DIAGNOSIS — M9905 Segmental and somatic dysfunction of pelvic region: Secondary | ICD-10-CM | POA: Diagnosis not present

## 2015-10-22 DIAGNOSIS — M9903 Segmental and somatic dysfunction of lumbar region: Secondary | ICD-10-CM | POA: Diagnosis not present

## 2015-10-22 DIAGNOSIS — M7062 Trochanteric bursitis, left hip: Secondary | ICD-10-CM | POA: Diagnosis not present

## 2015-10-24 ENCOUNTER — Other Ambulatory Visit: Payer: Self-pay | Admitting: Podiatry

## 2015-10-24 NOTE — Telephone Encounter (Signed)
Pt needs an appt prior to future refills. 

## 2016-01-29 ENCOUNTER — Telehealth: Payer: Self-pay

## 2016-01-29 NOTE — Telephone Encounter (Signed)
Patient called and left voicemail.  Called patient back and she says she has already gotten answers to her call.

## 2016-02-26 DIAGNOSIS — F329 Major depressive disorder, single episode, unspecified: Secondary | ICD-10-CM | POA: Diagnosis not present

## 2016-02-26 DIAGNOSIS — G4700 Insomnia, unspecified: Secondary | ICD-10-CM | POA: Diagnosis not present

## 2016-02-26 DIAGNOSIS — M170 Bilateral primary osteoarthritis of knee: Secondary | ICD-10-CM | POA: Diagnosis not present

## 2016-02-26 DIAGNOSIS — Z136 Encounter for screening for cardiovascular disorders: Secondary | ICD-10-CM | POA: Diagnosis not present

## 2016-02-26 DIAGNOSIS — F900 Attention-deficit hyperactivity disorder, predominantly inattentive type: Secondary | ICD-10-CM | POA: Diagnosis not present

## 2016-02-26 DIAGNOSIS — R5383 Other fatigue: Secondary | ICD-10-CM | POA: Diagnosis not present

## 2016-02-26 DIAGNOSIS — E039 Hypothyroidism, unspecified: Secondary | ICD-10-CM | POA: Diagnosis not present

## 2016-04-16 DIAGNOSIS — M1711 Unilateral primary osteoarthritis, right knee: Secondary | ICD-10-CM | POA: Diagnosis not present

## 2016-04-16 DIAGNOSIS — M1712 Unilateral primary osteoarthritis, left knee: Secondary | ICD-10-CM | POA: Diagnosis not present

## 2016-04-16 DIAGNOSIS — Z4789 Encounter for other orthopedic aftercare: Secondary | ICD-10-CM | POA: Diagnosis not present

## 2016-04-16 DIAGNOSIS — M170 Bilateral primary osteoarthritis of knee: Secondary | ICD-10-CM | POA: Diagnosis not present

## 2016-04-23 DIAGNOSIS — B9789 Other viral agents as the cause of diseases classified elsewhere: Secondary | ICD-10-CM | POA: Diagnosis not present

## 2016-04-23 DIAGNOSIS — F329 Major depressive disorder, single episode, unspecified: Secondary | ICD-10-CM | POA: Diagnosis not present

## 2016-04-23 DIAGNOSIS — J069 Acute upper respiratory infection, unspecified: Secondary | ICD-10-CM | POA: Diagnosis not present

## 2016-05-21 DIAGNOSIS — F900 Attention-deficit hyperactivity disorder, predominantly inattentive type: Secondary | ICD-10-CM | POA: Diagnosis not present

## 2016-05-21 DIAGNOSIS — F329 Major depressive disorder, single episode, unspecified: Secondary | ICD-10-CM | POA: Diagnosis not present

## 2016-05-30 DIAGNOSIS — M170 Bilateral primary osteoarthritis of knee: Secondary | ICD-10-CM | POA: Diagnosis not present

## 2016-05-30 DIAGNOSIS — M1612 Unilateral primary osteoarthritis, left hip: Secondary | ICD-10-CM | POA: Diagnosis not present

## 2016-06-03 DIAGNOSIS — M1612 Unilateral primary osteoarthritis, left hip: Secondary | ICD-10-CM | POA: Diagnosis not present

## 2016-07-09 DIAGNOSIS — M1711 Unilateral primary osteoarthritis, right knee: Secondary | ICD-10-CM | POA: Diagnosis not present

## 2016-07-09 DIAGNOSIS — M1712 Unilateral primary osteoarthritis, left knee: Secondary | ICD-10-CM | POA: Diagnosis not present

## 2016-07-09 DIAGNOSIS — M170 Bilateral primary osteoarthritis of knee: Secondary | ICD-10-CM | POA: Diagnosis not present

## 2016-07-16 DIAGNOSIS — M1712 Unilateral primary osteoarthritis, left knee: Secondary | ICD-10-CM | POA: Diagnosis not present

## 2016-07-16 DIAGNOSIS — M1711 Unilateral primary osteoarthritis, right knee: Secondary | ICD-10-CM | POA: Diagnosis not present

## 2016-07-16 DIAGNOSIS — M170 Bilateral primary osteoarthritis of knee: Secondary | ICD-10-CM | POA: Diagnosis not present

## 2016-07-18 DIAGNOSIS — M25552 Pain in left hip: Secondary | ICD-10-CM | POA: Diagnosis not present

## 2016-07-18 DIAGNOSIS — M1612 Unilateral primary osteoarthritis, left hip: Secondary | ICD-10-CM | POA: Diagnosis not present

## 2016-07-23 DIAGNOSIS — M1712 Unilateral primary osteoarthritis, left knee: Secondary | ICD-10-CM | POA: Diagnosis not present

## 2016-07-23 DIAGNOSIS — M170 Bilateral primary osteoarthritis of knee: Secondary | ICD-10-CM | POA: Diagnosis not present

## 2016-07-23 DIAGNOSIS — M1711 Unilateral primary osteoarthritis, right knee: Secondary | ICD-10-CM | POA: Diagnosis not present

## 2016-08-19 DIAGNOSIS — M1612 Unilateral primary osteoarthritis, left hip: Secondary | ICD-10-CM | POA: Diagnosis not present

## 2016-09-09 DIAGNOSIS — Z23 Encounter for immunization: Secondary | ICD-10-CM | POA: Diagnosis not present

## 2016-09-12 ENCOUNTER — Encounter (HOSPITAL_COMMUNITY): Payer: Self-pay

## 2016-09-18 NOTE — Patient Instructions (Addendum)
Sarah James  09/19/2016   Your procedure is scheduled on: 09/30/16  Report to [facility] Main  Entrance take [place]  elevators to 3rd floor to  [place] at [place]  AM.  Call this number if you have problems the morning of surgery [phone]   Remember: ONLY 1 PERSON MAY GO WITH YOU TO SHORT STAY TO GET  READY MORNING OF [place].  Do not eat food or drink liquids :After Midnight.     Take these medicines the morning of surgery with A SIP OF WATER: Levothyroxine, Wellbutrin, Prozac DO NOT TAKE ANY DIABETIC MEDICATIONS DAY OF YOUR SURGERY                               You may not have any metal on your body including hair pins and              piercings  Do not wear jewelry, make-up, lotions, powders or perfumes, deodorant             Do not wear nail polish.  Do not shave  48 hours prior to surgery.              Men may shave face and neck.   Do not bring valuables to the hospital. [place].  Contacts, dentures or bridgework may not be worn into surgery.  Leave suitcase in the car. After surgery it may be brought to your room.             [place] - Preparing for Surgery Before surgery, you can play an important role.  Because skin is not sterile, your skin needs to be as free of germs as possible.  You can reduce the number of germs on your skin by washing with CHG (chlorahexidine gluconate) soap before surgery.  CHG is an antiseptic cleaner which kills germs and bonds with the skin to continue killing germs even after washing. Please DO NOT use if you have an allergy to CHG or antibacterial soaps.  If your skin becomes reddened/irritated stop using the CHG and inform your nurse when you arrive at Short Stay. Do not shave (including legs and underarms) for at least 48 hours prior to the first CHG shower.  You may shave your face/neck. Please follow these instructions  carefully:  1.  Shower with CHG Soap the night before surgery and the  morning of Surgery.  2.  If you choose to wash your hair, wash your hair first as usual with your  normal  shampoo.  3.  After you shampoo, rinse your hair and body thoroughly to remove the  shampoo.                           4.  Use CHG as you would any other liquid soap.  You can apply chg directly  to the skin and wash                       Gently with a scrungie or clean washcloth.  5.  Apply the CHG Soap to your body ONLY FROM THE  NECK DOWN.   Do not use on face/ open                           Wound or open sores. Avoid contact with eyes, ears mouth and genitals (private parts).                       Wash face,  Genitals (private parts) with your normal soap.             6.  Wash thoroughly, paying special attention to the area where your surgery  will be performed.  7.  Thoroughly rinse your body with warm water from the neck down.  8.  DO NOT shower/wash with your normal soap after using and rinsing off  the CHG Soap.                9.  Pat yourself dry with a clean towel.            10.  Wear clean pajamas.            11.  Place clean sheets on your bed the night of your first shower and do not  sleep with pets. Day of Surgery : Do not apply any lotions/deodorants the morning of surgery.  Please wear clean clothes to the hospital/surgery center.  FAILURE TO FOLLOW THESE INSTRUCTIONS MAY RESULT IN THE CANCELLATION OF YOUR SURGERY PATIENT SIGNATURE_________________________________  NURSE SIGNATURE__________________________________  ________________________________________________________________________  WHAT IS A BLOOD TRANSFUSION? Blood Transfusion Information  A transfusion is the replacement of blood or some of its parts. Blood is made up of multiple cells which provide different functions.  Red blood cells carry oxygen and are used for blood loss replacement.  White blood cells fight against  infection.  Platelets control bleeding.  Plasma helps clot blood.  Other blood products are available for specialized needs, such as hemophilia or other clotting disorders. BEFORE THE TRANSFUSION  Who gives blood for transfusions?   Healthy volunteers who are fully evaluated to make sure their blood is safe. This is blood bank blood. Transfusion therapy is the safest it has ever been in the practice of medicine. Before blood is taken from a donor, a complete history is taken to make sure that person has no history of diseases nor engages in risky social behavior (examples are intravenous drug use or sexual activity with multiple partners). The donor's travel history is screened to minimize risk of transmitting infections, such as malaria. The donated blood is tested for signs of infectious diseases, such as HIV and hepatitis. The blood is then tested to be sure it is compatible with you in order to minimize the chance of a transfusion reaction. If you or a relative donates blood, this is often done in anticipation of surgery and is not appropriate for emergency situations. It takes many days to process the donated blood. RISKS AND COMPLICATIONS Although transfusion therapy is very safe and saves many lives, the main dangers of transfusion include:   Getting an infectious disease.  Developing a transfusion reaction. This is an allergic reaction to something in the blood you were given. Every precaution is taken to prevent this. The decision to have a blood transfusion has been considered carefully by your caregiver before blood is given. Blood is not given unless the benefits outweigh the risks. AFTER THE TRANSFUSION  Right after receiving a blood transfusion, you will usually feel much better and more  energetic. This is especially true if your red blood cells have gotten low (anemic). The transfusion raises the level of the red blood cells which carry oxygen, and this usually causes an energy  increase.  The nurse administering the transfusion will monitor you carefully for complications. HOME CARE INSTRUCTIONS  No special instructions are needed after a transfusion. You may find your energy is better. Speak with your caregiver about any limitations on activity for underlying diseases you may have. SEEK MEDICAL CARE IF:   Your condition is not improving after your transfusion.  You develop redness or irritation at the intravenous (IV) site. SEEK IMMEDIATE MEDICAL CARE IF:  Any of the following symptoms occur over the next 12 hours:  Shaking chills.  You have a temperature by mouth above 102 F (38.9 C), not controlled by medicine.  Chest, back, or muscle pain.  People around you feel you are not acting correctly or are confused.  Shortness of breath or difficulty breathing.  Dizziness and fainting.  You get a rash or develop hives.  You have a decrease in urine output.  Your urine turns a dark color or changes to pink, red, or brown. Any of the following symptoms occur over the next 10 days:  You have a temperature by mouth above 102 F (38.9 C), not controlled by medicine.  Shortness of breath.  Weakness after normal activity.  The white part of the eye turns yellow (jaundice).  You have a decrease in the amount of urine or are urinating less often.  Your urine turns a dark color or changes to pink, red, or brown. Document Released: 11/21/2000 Document Revised: 02/16/2012 Document Reviewed: 07/10/2008 [employer] Patient Information 2014 [place].  _______________________________________________________________________  Incentive Spirometer  An incentive spirometer is a tool that can help keep your lungs clear and active. This tool measures how well you are filling your lungs with each breath. Taking long deep breaths may help reverse or decrease the chance of developing breathing (pulmonary) problems (especially infection) following:  A long  period of time when you are unable to move or be active. BEFORE THE PROCEDURE   If the spirometer includes an indicator to show your best effort, your nurse or respiratory therapist will set it to a desired goal.  If possible, sit up straight or lean slightly forward. Try not to slouch.  Hold the incentive spirometer in an upright position. INSTRUCTIONS FOR USE  1. Sit on the edge of your bed if possible, or sit up as far as you can in bed or on a chair. 2. Hold the incentive spirometer in an upright position. 3. Breathe out normally. 4. Place the mouthpiece in your mouth and seal your lips tightly around it. 5. Breathe in slowly and as deeply as possible, raising the piston or the ball toward the top of the column. 6. Hold your breath for 3-5 seconds or for as long as possible. Allow the piston or ball to fall to the bottom of the column. 7. Remove the mouthpiece from your mouth and breathe out normally. 8. Rest for a few seconds and repeat Steps 1 through 7 at least 10 times every 1-2 hours when you are awake. Take your time and take a few normal breaths between deep breaths. 9. The spirometer may include an indicator to show your best effort. Use the indicator as a goal to work toward during each repetition. 10. After each set of 10 deep breaths, practice coughing to be sure your lungs are  clear. If you have an incision (the cut made at the time of surgery), support your incision when coughing by placing a pillow or rolled up towels firmly against it. Once you are able to get out of bed, walk around indoors and cough well. You may stop using the incentive spirometer when instructed by your caregiver.  RISKS AND COMPLICATIONS  Take your time so you do not get dizzy or light-headed.  If you are in pain, you may need to take or ask for pain medication before doing incentive spirometry. It is harder to take a deep breath if you are having pain. AFTER USE  Rest and breathe slowly and  easily.  It can be helpful to keep track of a log of your progress. Your caregiver can provide you with a simple table to help with this. If you are using the spirometer at home, follow these instructions: [place] IF:   You are having difficultly using the spirometer.  You have trouble using the spirometer as often as instructed.  Your pain medication is not giving enough relief while using the spirometer.  You develop fever of 100.5 F (38.1 C) or higher. SEEK IMMEDIATE MEDICAL CARE IF:   You cough up bloody sputum that had not been present before.  You develop fever of 102 F (38.9 C) or greater.  You develop worsening pain at or near the incision site. MAKE SURE YOU:   Understand these instructions.  Will watch your condition.  Will get help right away if you are not doing well or get worse. Document Released: 04/06/2007 Document Revised: 02/16/2012 Document Reviewed: 06/07/2007 [facility] Patient Information 2014 [place], [place].   ________________________________________________________________________

## 2016-09-19 ENCOUNTER — Encounter (HOSPITAL_COMMUNITY): Payer: Self-pay

## 2016-09-19 ENCOUNTER — Encounter (HOSPITAL_COMMUNITY): Payer: BLUE CROSS/BLUE SHIELD

## 2016-09-19 ENCOUNTER — Encounter (HOSPITAL_COMMUNITY)
Admission: RE | Admit: 2016-09-19 | Discharge: 2016-09-19 | Disposition: A | Discharge status: Home / Self Care | Payer: Medicare Other | Source: Ambulatory Visit | Attending: Orthopedic Surgery | Admitting: Orthopedic Surgery

## 2016-09-19 DIAGNOSIS — Z01812 Encounter for preprocedural laboratory examination: Secondary | ICD-10-CM | POA: Diagnosis not present

## 2016-09-19 DIAGNOSIS — M1612 Unilateral primary osteoarthritis, left hip: Secondary | ICD-10-CM | POA: Insufficient documentation

## 2016-09-19 DIAGNOSIS — M25552 Pain in left hip: Secondary | ICD-10-CM | POA: Diagnosis not present

## 2016-09-19 HISTORY — DX: Depression, unspecified: F32.A

## 2016-09-19 HISTORY — DX: Heartburn: R12

## 2016-09-19 HISTORY — DX: Major depressive disorder, single episode, unspecified: F32.9

## 2016-09-19 HISTORY — DX: Unspecified osteoarthritis, unspecified site: M19.90

## 2016-09-19 LAB — CBC
HEMATOCRIT: 35.5 % — AB (ref 36.0–46.0)
HEMOGLOBIN: 11.7 g/dL — AB (ref 12.0–15.0)
MCH: 29.6 pg (ref 26.0–34.0)
MCHC: 33 g/dL (ref 30.0–36.0)
MCV: 89.9 fL (ref 78.0–100.0)
Platelets: 297 10*3/uL (ref 150–400)
RBC: 3.95 MIL/uL (ref 3.87–5.11)
RDW: 13.5 % (ref 11.5–15.5)
WBC: 6.1 10*3/uL (ref 4.0–10.5)

## 2016-09-19 LAB — TYPE AND SCREEN
ABO/RH(D): A NEG
Antibody Screen: NEGATIVE

## 2016-09-19 LAB — SURGICAL PCR SCREEN
MRSA, PCR: NEGATIVE
Staphylococcus aureus: POSITIVE — AB

## 2016-09-19 LAB — ABO/RH: ABO/RH(D): A NEG

## 2016-09-19 NOTE — H&P (Addendum)
TOTAL HIP ADMISSION H&P  Patient is admitted for left total hip arthroplasty, anterior approach.  Subjective:  Chief Complaint:   Left hip primary OA / pain  HPI: Sarah James, 65 y.o. female, has a history of pain and functional disability in the left hip(s) due to arthritis and patient has failed non-surgical conservative treatments for greater than 12 weeks to include NSAID's and/or analgesics, corticosteriod injections and activity modification.  Onset of symptoms was gradual starting 1+ years ago with gradually worsening course since that time.The patient noted no past surgery on the left hip(s).  Patient currently rates pain in the left hip at 9 out of 10 with activity. Patient has worsening of pain with activity and weight bearing, trendelenberg gait, pain that interfers with activities of daily living and pain with passive range of motion. Patient has evidence of periarticular osteophytes and joint space narrowing by imaging studies. This condition presents safety issues increasing the risk of falls.  There is no current active infection.   Risks, benefits and expectations were discussed with the patient.  Risks including but not limited to the risk of anesthesia, blood clots, nerve damage, blood vessel damage, failure of the prosthesis, infection and up to and including death.  Patient understand the risks, benefits and expectations and wishes to proceed with surgery.   PCP: Eloise Levels, NP  D/C Plans:      Home  Post-op Meds:       No Rx given   Tranexamic Acid:      To be given - IV   Decadron:      Is to be given  FYI:     ASA  Norco    Past Medical History:  Diagnosis Date  . ADD (attention deficit disorder)   . Hypothyroidism     Past Surgical History:  Procedure Laterality Date  . deviated septum repair    . KNEE ARTHROSCOPY Right 2011  . VARICOSE VEIN SURGERY      No prescriptions prior to admission.   No Known Allergies   Social History  Substance Use  Topics  . Smoking status: Former Smoker    Quit date: 12/08/1990  . Smokeless tobacco: Not on file  . Alcohol use 0.0 oz/week     Comment: 1 or 2 a month    Family History  Problem Relation Age of Onset  . Arthritis Mother   . Hypertension Mother   . Lung cancer Father   . Stroke Maternal Grandmother   . Hypertension Maternal Grandmother   . Heart disease Paternal Grandfather      Review of Systems  Constitutional: Negative.   HENT: Negative.   Eyes: Negative.   Respiratory: Negative.   Cardiovascular: Negative.   Gastrointestinal: Positive for heartburn.  Genitourinary: Negative.   Musculoskeletal: Positive for joint pain.  Skin: Negative.   Neurological: Negative.   Endo/Heme/Allergies: Negative.   Psychiatric/Behavioral: Negative.     Objective:  Physical Exam  Constitutional: She is oriented to person, place, and time. She appears well-developed.  HENT:  Head: Normocephalic.  Eyes: Pupils are equal, round, and reactive to light.  Neck: Neck supple. No JVD present. No tracheal deviation present. No thyromegaly present.  Cardiovascular: Normal rate, regular rhythm, normal heart sounds and intact distal pulses.   Respiratory: Effort normal and breath sounds normal. No respiratory distress. She has no wheezes.  GI: Soft. There is no tenderness. There is no guarding.  Musculoskeletal:       Left hip: She exhibits decreased  range of motion, decreased strength, tenderness and bony tenderness. She exhibits no swelling, no deformity and no laceration.  Lymphadenopathy:    She has no cervical adenopathy.  Neurological: She is alert and oriented to person, place, and time.  Skin: Skin is warm and dry.  Psychiatric: She has a normal mood and affect.       Labs:  Estimated body mass index is 33.21 kg/m as calculated from the following:   Height as of 12/25/14: 5' 4.25" (1.632 m).   Weight as of 12/25/14: 88.5 kg (195 lb).   Imaging Review Plain radiographs  demonstrate severe degenerative joint disease of the left hip(s). The bone quality appears to be good for age and reported activity level.  Assessment/Plan:  End stage arthritis, left hip(s)  The patient history, physical examination, clinical judgement of the provider and imaging studies are consistent with end stage degenerative joint disease of the left hip(s) and total hip arthroplasty is deemed medically necessary. The treatment options including medical management, injection therapy, arthroscopy and arthroplasty were discussed at length. The risks and benefits of total hip arthroplasty were presented and reviewed. The risks due to aseptic loosening, infection, stiffness, dislocation/subluxation,  thromboembolic complications and other imponderables were discussed.  The patient acknowledged the explanation, agreed to proceed with the plan and consent was signed. Patient is being admitted for inpatient treatment for surgery, pain control, PT, OT, prophylactic antibiotics, VTE prophylaxis, progressive ambulation and ADL's and discharge planning.The patient is planning to be discharged home.    West Pugh Luverne Zerkle   PA-C  09/19/2016, 11:46 AM

## 2016-09-22 ENCOUNTER — Other Ambulatory Visit: Payer: Self-pay | Admitting: Family

## 2016-09-22 DIAGNOSIS — Z01818 Encounter for other preprocedural examination: Secondary | ICD-10-CM | POA: Diagnosis not present

## 2016-09-22 DIAGNOSIS — M25559 Pain in unspecified hip: Secondary | ICD-10-CM | POA: Diagnosis not present

## 2016-09-22 DIAGNOSIS — E039 Hypothyroidism, unspecified: Secondary | ICD-10-CM | POA: Diagnosis not present

## 2016-09-22 DIAGNOSIS — Z1231 Encounter for screening mammogram for malignant neoplasm of breast: Secondary | ICD-10-CM

## 2016-09-22 DIAGNOSIS — F900 Attention-deficit hyperactivity disorder, predominantly inattentive type: Secondary | ICD-10-CM | POA: Diagnosis not present

## 2016-09-22 DIAGNOSIS — F329 Major depressive disorder, single episode, unspecified: Secondary | ICD-10-CM | POA: Diagnosis not present

## 2016-09-23 ENCOUNTER — Ambulatory Visit
Admission: RE | Admit: 2016-09-23 | Discharge: 2016-09-23 | Disposition: A | Discharge status: Home / Self Care | Payer: Medicare Other | Source: Ambulatory Visit | Attending: Family | Admitting: Family

## 2016-09-23 ENCOUNTER — Other Ambulatory Visit: Payer: Self-pay | Admitting: Family

## 2016-09-23 DIAGNOSIS — N6489 Other specified disorders of breast: Secondary | ICD-10-CM

## 2016-09-23 DIAGNOSIS — S2000XA Contusion of breast, unspecified breast, initial encounter: Secondary | ICD-10-CM

## 2016-09-23 DIAGNOSIS — Z1231 Encounter for screening mammogram for malignant neoplasm of breast: Secondary | ICD-10-CM

## 2016-09-29 ENCOUNTER — Ambulatory Visit
Admission: RE | Admit: 2016-09-29 | Discharge: 2016-09-29 | Disposition: A | Discharge status: Home / Self Care | Payer: Medicare Other | Source: Ambulatory Visit | Attending: Family | Admitting: Family

## 2016-09-29 ENCOUNTER — Other Ambulatory Visit: Payer: Self-pay | Admitting: Family

## 2016-09-29 DIAGNOSIS — N6489 Other specified disorders of breast: Secondary | ICD-10-CM

## 2016-09-29 DIAGNOSIS — S2000XA Contusion of breast, unspecified breast, initial encounter: Secondary | ICD-10-CM

## 2016-09-29 DIAGNOSIS — R928 Other abnormal and inconclusive findings on diagnostic imaging of breast: Secondary | ICD-10-CM | POA: Diagnosis not present

## 2016-09-29 NOTE — Anesthesia Preprocedure Evaluation (Signed)
Anesthesia Evaluation  Patient identified by MRN, date of birth, ID band Patient awake    Reviewed: Allergy & Precautions, H&P , Patient's Chart, lab work & pertinent test results  Airway Mallampati: II  TM Distance: >3 FB Neck ROM: full    Dental no notable dental hx.    Pulmonary former smoker,    Pulmonary exam normal breath sounds clear to auscultation       Cardiovascular Exercise Tolerance: Good  Rhythm:regular Rate:Normal     Neuro/Psych    GI/Hepatic   Endo/Other    Renal/GU      Musculoskeletal   Abdominal   Peds  Hematology   Anesthesia Other Findings   Reproductive/Obstetrics                             Anesthesia Physical Anesthesia Plan  ASA: II  Anesthesia Plan: Spinal   Post-op Pain Management:    Induction:   Airway Management Planned:   Additional Equipment:   Intra-op Plan:   Post-operative Plan:   Informed Consent: I have reviewed the patients History and Physical, chart, labs and discussed the procedure including the risks, benefits and alternatives for the proposed anesthesia with the patient or authorized representative who has indicated his/her understanding and acceptance.   Dental Advisory Given  Plan Discussed with: CRNA  Anesthesia Plan Comments: (Lab work confirmed with CRNA in room. Platelets okay. Discussed spinal anesthetic, and patient consents to the procedure:  included risk of possible headache,backache, failed block, allergic reaction, and nerve injury. This patient was asked if she had any questions or concerns before the procedure started. )        Anesthesia Quick Evaluation

## 2016-09-30 ENCOUNTER — Inpatient Hospital Stay (HOSPITAL_COMMUNITY): Payer: Medicare Other

## 2016-09-30 ENCOUNTER — Inpatient Hospital Stay (HOSPITAL_COMMUNITY)
Admission: RE | Admit: 2016-09-30 | Discharge: 2016-10-01 | DRG: 470 | Disposition: A | Discharge status: Home Health | Payer: Medicare Other | Source: Ambulatory Visit | Attending: Orthopedic Surgery | Admitting: Orthopedic Surgery

## 2016-09-30 ENCOUNTER — Encounter (HOSPITAL_COMMUNITY)
Admission: RE | Disposition: A | Discharge status: Home Health | Payer: Self-pay | Source: Ambulatory Visit | Attending: Orthopedic Surgery

## 2016-09-30 ENCOUNTER — Inpatient Hospital Stay (HOSPITAL_COMMUNITY): Payer: Medicare Other | Admitting: Anesthesiology

## 2016-09-30 ENCOUNTER — Encounter (HOSPITAL_COMMUNITY): Payer: Self-pay | Admitting: *Deleted

## 2016-09-30 DIAGNOSIS — Z87891 Personal history of nicotine dependence: Secondary | ICD-10-CM

## 2016-09-30 DIAGNOSIS — Z79899 Other long term (current) drug therapy: Secondary | ICD-10-CM | POA: Diagnosis not present

## 2016-09-30 DIAGNOSIS — M25752 Osteophyte, left hip: Secondary | ICD-10-CM | POA: Diagnosis present

## 2016-09-30 DIAGNOSIS — M1612 Unilateral primary osteoarthritis, left hip: Principal | ICD-10-CM | POA: Diagnosis present

## 2016-09-30 DIAGNOSIS — Z8261 Family history of arthritis: Secondary | ICD-10-CM | POA: Diagnosis not present

## 2016-09-30 DIAGNOSIS — E669 Obesity, unspecified: Secondary | ICD-10-CM | POA: Diagnosis not present

## 2016-09-30 DIAGNOSIS — Z8249 Family history of ischemic heart disease and other diseases of the circulatory system: Secondary | ICD-10-CM

## 2016-09-30 DIAGNOSIS — Z6830 Body mass index (BMI) 30.0-30.9, adult: Secondary | ICD-10-CM

## 2016-09-30 DIAGNOSIS — Z801 Family history of malignant neoplasm of trachea, bronchus and lung: Secondary | ICD-10-CM | POA: Diagnosis not present

## 2016-09-30 DIAGNOSIS — E039 Hypothyroidism, unspecified: Secondary | ICD-10-CM | POA: Diagnosis not present

## 2016-09-30 DIAGNOSIS — F329 Major depressive disorder, single episode, unspecified: Secondary | ICD-10-CM | POA: Diagnosis present

## 2016-09-30 DIAGNOSIS — Z823 Family history of stroke: Secondary | ICD-10-CM | POA: Diagnosis not present

## 2016-09-30 DIAGNOSIS — R12 Heartburn: Secondary | ICD-10-CM | POA: Diagnosis not present

## 2016-09-30 DIAGNOSIS — M25552 Pain in left hip: Secondary | ICD-10-CM | POA: Diagnosis not present

## 2016-09-30 DIAGNOSIS — Z471 Aftercare following joint replacement surgery: Secondary | ICD-10-CM | POA: Diagnosis not present

## 2016-09-30 DIAGNOSIS — Z96649 Presence of unspecified artificial hip joint: Secondary | ICD-10-CM

## 2016-09-30 DIAGNOSIS — F988 Other specified behavioral and emotional disorders with onset usually occurring in childhood and adolescence: Secondary | ICD-10-CM | POA: Diagnosis present

## 2016-09-30 DIAGNOSIS — Z96642 Presence of left artificial hip joint: Secondary | ICD-10-CM | POA: Diagnosis not present

## 2016-09-30 HISTORY — PX: TOTAL HIP ARTHROPLASTY: SHX124

## 2016-09-30 SURGERY — ARTHROPLASTY, HIP, TOTAL, ANTERIOR APPROACH
Anesthesia: Spinal | Site: Hip | Laterality: Left

## 2016-09-30 MED ORDER — SODIUM CHLORIDE 0.9 % IR SOLN
Status: DC | PRN
  Administered 2016-09-30: 1000 mL

## 2016-09-30 MED ORDER — CEFAZOLIN SODIUM-DEXTROSE 2-4 GM/100ML-% IV SOLN
2.0000 g | Freq: Four times a day (QID) | INTRAVENOUS | Status: AC
  Administered 2016-09-30 (×2): 2 g via INTRAVENOUS
  Filled 2016-09-30 (×2): qty 100

## 2016-09-30 MED ORDER — BISACODYL 10 MG RE SUPP: 10.0000 mg | Freq: Every day | RECTAL | Status: DC | PRN

## 2016-09-30 MED ORDER — ONDANSETRON HCL 4 MG PO TABS: 4.0000 mg | ORAL_TABLET | Freq: Four times a day (QID) | ORAL | Status: DC | PRN

## 2016-09-30 MED ORDER — PHENYLEPHRINE HCL 10 MG/ML IJ SOLN
INTRAMUSCULAR | Status: DC | PRN
  Administered 2016-09-30: 80 ug via INTRAVENOUS
  Administered 2016-09-30 (×7): 40 ug via INTRAVENOUS

## 2016-09-30 MED ORDER — DIPHENHYDRAMINE HCL 25 MG PO CAPS: 25.0000 mg | ORAL_CAPSULE | Freq: Four times a day (QID) | ORAL | Status: DC | PRN

## 2016-09-30 MED ORDER — ONDANSETRON HCL 4 MG/2ML IJ SOLN
4.0000 mg | Freq: Four times a day (QID) | INTRAMUSCULAR | Status: DC | PRN
  Administered 2016-09-30: 4 mg via INTRAVENOUS
  Filled 2016-09-30: qty 2

## 2016-09-30 MED ORDER — AMPHETAMINE-DEXTROAMPHET ER 5 MG PO CP24
25.0000 mg | ORAL_CAPSULE | ORAL | Status: DC
  Administered 2016-10-01: 25 mg via ORAL
  Filled 2016-09-30: qty 1

## 2016-09-30 MED ORDER — PROPOFOL 10 MG/ML IV BOLUS
INTRAVENOUS | Status: AC
  Filled 2016-09-30: qty 40

## 2016-09-30 MED ORDER — BUPROPION HCL ER (XL) 150 MG PO TB24
150.0000 mg | ORAL_TABLET | Freq: Every day | ORAL | Status: DC
  Administered 2016-10-01: 150 mg via ORAL
  Filled 2016-09-30: qty 1

## 2016-09-30 MED ORDER — METOCLOPRAMIDE HCL 5 MG/ML IJ SOLN
5.0000 mg | Freq: Three times a day (TID) | INTRAMUSCULAR | Status: DC | PRN
  Administered 2016-09-30: 10 mg via INTRAVENOUS
  Filled 2016-09-30: qty 2

## 2016-09-30 MED ORDER — LEVOTHYROXINE SODIUM 88 MCG PO TABS
88.0000 ug | ORAL_TABLET | Freq: Every day | ORAL | Status: DC
  Administered 2016-10-01: 88 ug via ORAL
  Filled 2016-09-30: qty 1

## 2016-09-30 MED ORDER — HYDROMORPHONE HCL 1 MG/ML IJ SOLN
0.5000 mg | INTRAMUSCULAR | Status: DC | PRN
  Administered 2016-09-30: 1 mg via INTRAVENOUS
  Filled 2016-09-30: qty 1

## 2016-09-30 MED ORDER — POLYETHYLENE GLYCOL 3350 17 G PO PACK
17.0000 g | PACK | Freq: Two times a day (BID) | ORAL | Status: DC
  Administered 2016-09-30: 17 g via ORAL
  Filled 2016-09-30 (×2): qty 1

## 2016-09-30 MED ORDER — TRANEXAMIC ACID 1000 MG/10ML IV SOLN
1000.0000 mg | INTRAVENOUS | Status: AC
  Administered 2016-09-30: 1000 mg via INTRAVENOUS
  Filled 2016-09-30: qty 1100

## 2016-09-30 MED ORDER — ONDANSETRON HCL 4 MG/2ML IJ SOLN
INTRAMUSCULAR | Status: AC
  Filled 2016-09-30: qty 2

## 2016-09-30 MED ORDER — STERILE WATER FOR IRRIGATION IR SOLN
Status: DC | PRN
  Administered 2016-09-30: 2000 mL

## 2016-09-30 MED ORDER — CELECOXIB 200 MG PO CAPS
200.0000 mg | ORAL_CAPSULE | Freq: Two times a day (BID) | ORAL | Status: DC
Start: 2016-09-30 — End: 2016-10-01
  Administered 2016-09-30 – 2016-10-01 (×2): 200 mg via ORAL
  Filled 2016-09-30 (×2): qty 1

## 2016-09-30 MED ORDER — MIDAZOLAM HCL 2 MG/2ML IJ SOLN
INTRAMUSCULAR | Status: AC
  Filled 2016-09-30: qty 2

## 2016-09-30 MED ORDER — METOCLOPRAMIDE HCL 5 MG PO TABS
5.0000 mg | ORAL_TABLET | Freq: Three times a day (TID) | ORAL | Status: DC | PRN
  Filled 2016-09-30: qty 2

## 2016-09-30 MED ORDER — ALUM & MAG HYDROXIDE-SIMETH 200-200-20 MG/5ML PO SUSP: 30.0000 mL | ORAL | Status: DC | PRN

## 2016-09-30 MED ORDER — CEFAZOLIN SODIUM-DEXTROSE 2-4 GM/100ML-% IV SOLN
2.0000 g | INTRAVENOUS | Status: AC
  Administered 2016-09-30: 2 g via INTRAVENOUS
  Filled 2016-09-30: qty 100

## 2016-09-30 MED ORDER — FERROUS SULFATE 325 (65 FE) MG PO TABS
325.0000 mg | ORAL_TABLET | Freq: Three times a day (TID) | ORAL | Status: DC
  Administered 2016-10-01: 325 mg via ORAL
  Filled 2016-09-30: qty 1

## 2016-09-30 MED ORDER — AMPHETAMINE-DEXTROAMPHET ER 5 MG PO CP24: 25.0000 mg | ORAL_CAPSULE | ORAL | Status: DC

## 2016-09-30 MED ORDER — HYDROCODONE-ACETAMINOPHEN 7.5-325 MG PO TABS
1.0000 | ORAL_TABLET | ORAL | Status: DC
  Administered 2016-09-30: 1 via ORAL
  Administered 2016-09-30 (×3): 2 via ORAL
  Administered 2016-09-30: 1 via ORAL
  Administered 2016-10-01: 2 via ORAL
  Administered 2016-10-01: 1 via ORAL
  Filled 2016-09-30: qty 1
  Filled 2016-09-30: qty 2
  Filled 2016-09-30: qty 1
  Filled 2016-09-30 (×2): qty 2
  Filled 2016-09-30: qty 1
  Filled 2016-09-30: qty 2

## 2016-09-30 MED ORDER — METHOCARBAMOL 500 MG PO TABS
500.0000 mg | ORAL_TABLET | Freq: Four times a day (QID) | ORAL | Status: DC | PRN
  Administered 2016-10-01: 500 mg via ORAL
  Filled 2016-09-30: qty 1

## 2016-09-30 MED ORDER — PHENOL 1.4 % MT LIQD
1.0000 | OROMUCOSAL | Status: DC | PRN
  Filled 2016-09-30: qty 177

## 2016-09-30 MED ORDER — DEXAMETHASONE SODIUM PHOSPHATE 10 MG/ML IJ SOLN
10.0000 mg | Freq: Once | INTRAMUSCULAR | Status: AC
  Administered 2016-10-01: 10 mg via INTRAVENOUS
  Filled 2016-09-30: qty 1

## 2016-09-30 MED ORDER — FLUOXETINE HCL 10 MG PO CAPS
10.0000 mg | ORAL_CAPSULE | Freq: Every day | ORAL | Status: DC
  Administered 2016-10-01: 10 mg via ORAL
  Filled 2016-09-30: qty 1

## 2016-09-30 MED ORDER — MIDAZOLAM HCL 5 MG/5ML IJ SOLN
INTRAMUSCULAR | Status: DC | PRN
  Administered 2016-09-30 (×2): 0.5 mg via INTRAVENOUS
  Administered 2016-09-30: 1 mg via INTRAVENOUS

## 2016-09-30 MED ORDER — METHOCARBAMOL 1000 MG/10ML IJ SOLN
500.0000 mg | Freq: Four times a day (QID) | INTRAVENOUS | Status: DC | PRN
  Administered 2016-09-30: 500 mg via INTRAVENOUS
  Filled 2016-09-30: qty 5
  Filled 2016-09-30: qty 550

## 2016-09-30 MED ORDER — HYDROMORPHONE HCL 1 MG/ML IJ SOLN
0.2500 mg | INTRAMUSCULAR | Status: DC | PRN
  Administered 2016-09-30 (×2): 0.5 mg via INTRAVENOUS

## 2016-09-30 MED ORDER — DEXAMETHASONE SODIUM PHOSPHATE 10 MG/ML IJ SOLN
INTRAMUSCULAR | Status: AC
  Filled 2016-09-30: qty 1

## 2016-09-30 MED ORDER — MENTHOL 3 MG MT LOZG: 1.0000 | LOZENGE | OROMUCOSAL | Status: DC | PRN

## 2016-09-30 MED ORDER — DEXAMETHASONE SODIUM PHOSPHATE 10 MG/ML IJ SOLN
INTRAMUSCULAR | Status: DC | PRN
  Administered 2016-09-30: 10 mg via INTRAVENOUS

## 2016-09-30 MED ORDER — LACTATED RINGERS IV SOLN
INTRAVENOUS | Status: DC | PRN
  Administered 2016-09-30 (×2): via INTRAVENOUS

## 2016-09-30 MED ORDER — BUPIVACAINE IN DEXTROSE 0.75-8.25 % IT SOLN
INTRATHECAL | Status: DC | PRN
Start: 2016-09-30 — End: 2016-09-30
  Administered 2016-09-30: 2 mL via INTRATHECAL

## 2016-09-30 MED ORDER — CHLORHEXIDINE GLUCONATE 4 % EX LIQD: 60.0000 mL | Freq: Once | CUTANEOUS | Status: DC

## 2016-09-30 MED ORDER — HYDROMORPHONE HCL 1 MG/ML IJ SOLN
INTRAMUSCULAR | Status: AC
  Filled 2016-09-30: qty 1

## 2016-09-30 MED ORDER — SODIUM CHLORIDE 0.9 % IV SOLN
100.0000 mL/h | INTRAVENOUS | Status: DC
  Administered 2016-09-30 – 2016-10-01 (×3): 100 mL/h via INTRAVENOUS
  Filled 2016-09-30 (×6): qty 1000

## 2016-09-30 MED ORDER — FENTANYL CITRATE (PF) 100 MCG/2ML IJ SOLN
INTRAMUSCULAR | Status: AC
  Filled 2016-09-30: qty 2

## 2016-09-30 MED ORDER — CEFAZOLIN SODIUM-DEXTROSE 2-4 GM/100ML-% IV SOLN
INTRAVENOUS | Status: AC
  Filled 2016-09-30: qty 100

## 2016-09-30 MED ORDER — ONDANSETRON HCL 4 MG/2ML IJ SOLN
INTRAMUSCULAR | Status: DC | PRN
  Administered 2016-09-30: 4 mg via INTRAVENOUS

## 2016-09-30 MED ORDER — ASPIRIN 81 MG PO CHEW
81.0000 mg | CHEWABLE_TABLET | Freq: Two times a day (BID) | ORAL | Status: DC
  Administered 2016-09-30 – 2016-10-01 (×2): 81 mg via ORAL
  Filled 2016-09-30 (×2): qty 1

## 2016-09-30 MED ORDER — PROPOFOL 500 MG/50ML IV EMUL
INTRAVENOUS | Status: DC | PRN
  Administered 2016-09-30: 150 ug/kg/min via INTRAVENOUS

## 2016-09-30 MED ORDER — MAGNESIUM CITRATE PO SOLN: 1.0000 | Freq: Once | ORAL | Status: DC | PRN

## 2016-09-30 MED ORDER — DEXAMETHASONE SODIUM PHOSPHATE 10 MG/ML IJ SOLN: 10.0000 mg | Freq: Once | INTRAMUSCULAR | Status: DC

## 2016-09-30 MED ORDER — FENTANYL CITRATE (PF) 100 MCG/2ML IJ SOLN
INTRAMUSCULAR | Status: DC | PRN
  Administered 2016-09-30 (×2): 50 ug via INTRAVENOUS

## 2016-09-30 MED ORDER — DOCUSATE SODIUM 100 MG PO CAPS
100.0000 mg | ORAL_CAPSULE | Freq: Two times a day (BID) | ORAL | Status: DC
  Administered 2016-09-30 – 2016-10-01 (×2): 100 mg via ORAL
  Filled 2016-09-30 (×2): qty 1

## 2016-09-30 MED ORDER — PHENYLEPHRINE 40 MCG/ML (10ML) SYRINGE FOR IV PUSH (FOR BLOOD PRESSURE SUPPORT)
PREFILLED_SYRINGE | INTRAVENOUS | Status: AC
Start: 2016-09-30 — End: 2016-09-30
  Filled 2016-09-30: qty 10

## 2016-09-30 SURGICAL SUPPLY — 38 items
ADH SKN CLS APL DERMABOND .7 (GAUZE/BANDAGES/DRESSINGS) ×1
BAG SPEC THK2 15X12 ZIP CLS (MISCELLANEOUS) ×1
BAG ZIPLOCK 12X15 (MISCELLANEOUS) ×2 IMPLANT
CAPT HIP TOTAL 2 ×2 IMPLANT
CLOTH BEACON ORANGE TIMEOUT ST (SAFETY) ×3 IMPLANT
COVER PERINEAL POST (MISCELLANEOUS) ×3 IMPLANT
DERMABOND ADVANCED (GAUZE/BANDAGES/DRESSINGS) ×2
DERMABOND ADVANCED .7 DNX12 (GAUZE/BANDAGES/DRESSINGS) ×1 IMPLANT
DRAPE STERI IOBAN 125X83 (DRAPES) ×3 IMPLANT
DRAPE U-SHAPE 47X51 STRL (DRAPES) ×6 IMPLANT
DRESSING AQUACEL AG SP 3.5X10 (GAUZE/BANDAGES/DRESSINGS) ×1 IMPLANT
DRSG AQUACEL AG SP 3.5X10 (GAUZE/BANDAGES/DRESSINGS) ×3
DURAPREP 26ML APPLICATOR (WOUND CARE) ×3 IMPLANT
ELECT REM PT RETURN 9FT ADLT (ELECTROSURGICAL) ×3
ELECTRODE REM PT RTRN 9FT ADLT (ELECTROSURGICAL) ×1 IMPLANT
GLOVE BIOGEL PI IND STRL 6.5 (GLOVE) IMPLANT
GLOVE BIOGEL PI IND STRL 7.0 (GLOVE) IMPLANT
GLOVE BIOGEL PI IND STRL 7.5 (GLOVE) ×1 IMPLANT
GLOVE BIOGEL PI IND STRL 8 (GLOVE) IMPLANT
GLOVE BIOGEL PI INDICATOR 6.5 (GLOVE) ×2
GLOVE BIOGEL PI INDICATOR 7.0 (GLOVE) ×4
GLOVE BIOGEL PI INDICATOR 7.5 (GLOVE) ×6
GLOVE BIOGEL PI INDICATOR 8 (GLOVE) ×2
GLOVE ECLIPSE 8.0 STRL XLNG CF (GLOVE) ×6 IMPLANT
GLOVE ORTHO TXT STRL SZ7.5 (GLOVE) ×3 IMPLANT
GLOVE SURG SS PI 7.0 STRL IVOR (GLOVE) ×2 IMPLANT
GOWN STRL REUS W/TWL LRG LVL3 (GOWN DISPOSABLE) ×5 IMPLANT
GOWN STRL REUS W/TWL XL LVL3 (GOWN DISPOSABLE) ×5 IMPLANT
HOLDER FOLEY CATH W/STRAP (MISCELLANEOUS) ×3 IMPLANT
PACK ANTERIOR HIP CUSTOM (KITS) ×3 IMPLANT
SAW OSC TIP CART 19.5X105X1.3 (SAW) ×3 IMPLANT
SUT MNCRL AB 4-0 PS2 18 (SUTURE) ×3 IMPLANT
SUT VIC AB 1 CT1 36 (SUTURE) ×9 IMPLANT
SUT VIC AB 2-0 CT1 27 (SUTURE) ×6
SUT VIC AB 2-0 CT1 TAPERPNT 27 (SUTURE) ×2 IMPLANT
SUT VLOC 180 0 24IN GS25 (SUTURE) ×3 IMPLANT
TRAY FOLEY CATH SILVER 14FR (SET/KITS/TRAYS/PACK) ×2 IMPLANT
YANKAUER SUCT BULB TIP 10FT TU (MISCELLANEOUS) ×2 IMPLANT

## 2016-09-30 NOTE — Anesthesia Procedure Notes (Signed)
Spinal

## 2016-09-30 NOTE — Transfer of Care (Signed)
Immediate Anesthesia Transfer of Care Note  Patient: Sarah James  Procedure(s) Performed: Procedure(s): LEFT TOTAL HIP ARTHROPLASTY ANTERIOR APPROACH (Left)  Patient Location: PACU  Anesthesia Type:Spinal  Level of Consciousness: awake, alert , oriented and patient cooperative  Airway & Oxygen Therapy: Patient Spontanous Breathing and Patient connected to face mask oxygen  Post-op Assessment: Report given to RN and Post -op Vital signs reviewed and stable  Post vital signs: Reviewed and stable  Last Vitals:  Vitals:   09/30/16 0510  BP: 116/83  Pulse: 76  Resp: 20  Temp: 36.5 C    Last Pain:  Vitals:   09/30/16 0510  TempSrc: Oral      Patients Stated Pain Goal: 4 ([id number])  Complications: No apparent anesthesia complications

## 2016-09-30 NOTE — Discharge Instructions (Signed)

## 2016-09-30 NOTE — Op Note (Signed)
NAME:  Sarah James                ACCOUNT NO.: [id number]      MEDICAL RECORD NO.: [id number]      FACILITY:  [facility]      PHYSICIAN:  Paralee Cancel D  DATE OF BIRTH:  21-Sep-1951     DATE OF PROCEDURE:  09/30/2016                                 OPERATIVE REPORT         PREOPERATIVE DIAGNOSIS: Left  hip osteoarthritis.      POSTOPERATIVE DIAGNOSIS:  Left hip osteoarthritis.      PROCEDURE:  Left total hip replacement through an anterior approach   utilizing DePuy THR system, component size 77mm pinnacle cup, a size 36+4 neutral   Altrex liner, a size 4 Standard Tri Lock stem with a 36+1.5 delta ceramic   ball.      SURGEON:  Pietro Cassis. Alvan Dame, M.D.      ASSISTANT:  Molli Barrows, PA-C     ANESTHESIA:  Spinal.      SPECIMENS:  None.      COMPLICATIONS:  None.      BLOOD LOSS:  250 cc     DRAINS:  None.      INDICATION OF THE PROCEDURE:  Sarah James is a 65 y.o. female who had   presented to office for evaluation of left hip pain.  Radiographs revealed   progressive degenerative changes with bone-on-bone   articulation to the  hip joint.  The patient had painful limited range of   motion significantly affecting their overall quality of life.  The patient was failing to    respond to conservative measures, and at this point was ready   to proceed with more definitive measures.  The patient has noted progressive   degenerative changes in his hip, progressive problems and dysfunction   with regarding the hip prior to surgery.  Consent was obtained for   benefit of pain relief.  Specific risk of infection, DVT, component   failure, dislocation, need for revision surgery, as well discussion of   the anterior versus posterior approach were reviewed.  Consent was   obtained for benefit of anterior pain relief through an anterior   approach.      PROCEDURE IN DETAIL:  The patient was brought to operative theater.   Once adequate anesthesia,  preoperative antibiotics, 2gm of Ancef, 1 gm of Tranexamic Acid, and 10 mg of Decadron administered.   The patient was positioned supine on the OSI Hanna table.  Once adequate   padding of boney process was carried out, we had predraped out the hip, and  used fluoroscopy to confirm orientation of the pelvis and position.      The left hip was then prepped and draped from proximal iliac crest to   mid thigh with shower curtain technique.      Time-out was performed identifying the patient, planned procedure, and   extremity.     An incision was then made 2 cm distal and lateral to the   anterior superior iliac spine extending over the orientation of the   tensor fascia lata muscle and sharp dissection was carried down to the   fascia of the muscle and protractor placed in the soft tissues.      The fascia was then incised.  The muscle belly was identified and swept   laterally and retractor placed along the superior neck.  Following   cauterization of the circumflex vessels and removing some pericapsular   fat, a second cobra retractor was placed on the inferior neck.  A third   retractor was placed on the anterior acetabulum after elevating the   anterior rectus.  A L-capsulotomy was along the line of the   superior neck to the trochanteric fossa, then extended proximally and   distally.  Tag sutures were placed and the retractors were then placed   intracapsular.  We then identified the trochanteric fossa and   orientation of my neck cut, confirmed this radiographically   and then made a neck osteotomy with the femur on traction.  The femoral   head was removed without difficulty or complication.  Traction was let   off and retractors were placed posterior and anterior around the   acetabulum.      The labrum and foveal tissue were debrided.  I began reaming with a 40mm   reamer and reamed up to 66mm reamer with good bony bed preparation and a 40mm   cup was chosen.  The final 35mm  Pinnacle cup was then impacted under fluoroscopy  to confirm the depth of penetration and orientation with respect to   abduction.  A screw was placed followed by the hole eliminator.  The final   36+4 neutral Altrex liner was impacted with good visualized rim fit.  The cup was positioned anatomically within the acetabular portion of the pelvis.      At this point, the femur was rolled at 80 degrees.  Further capsule was   released off the inferior aspect of the femoral neck.  I then   released the superior capsule proximally.  The hook was placed laterally   along the femur and elevated manually and held in position with the bed   hook.  The leg was then extended and adducted with the leg rolled to 100   degrees of external rotation.  Once the proximal femur was fully   exposed, I used a box osteotome to set orientation.  I then began   broaching with the starting chili pepper broach and passed this by hand and then broached up to 4.  With the 4 broach in place I chose a standard versus high offset (based on trials) neck and did trial reductions.  The offset was appropriate, leg lengths   appeared to be equal best matched with the +1.5 head ball confirmed radiographically.   Given these findings, I went ahead and dislocated the hip, repositioned all   retractors and positioned the right hip in the extended and abducted position.  The final 4 Standard Tri Lock stem was   chosen and it was impacted down to the level of neck cut.  Based on this   and the trial reduction, a 36+1.5 delta ceramic ball was chosen and   impacted onto a clean and dry trunnion, and the hip was reduced.  The   hip had been irrigated throughout the case again at this point.  I did   reapproximate the superior capsular leaflet to the anterior leaflet   using #1 Vicryl.  The fascia of the   tensor fascia lata muscle was then reapproximated using #1 Vicryl and #0 V-lock sutures.  The   remaining wound was closed with 2-0  Vicryl and running 4-0 Monocryl.   The hip was cleaned, dried,  and dressed sterilely using Dermabond and   Aquacel dressing.  She was then brought   to recovery room in stable condition tolerating the procedure well.    Molli Barrows, PA-C was present for the entirety of the case involved from   preoperative positioning, perioperative retractor management, general   facilitation of the case, as well as primary wound closure as assistant.            Pietro Cassis Alvan Dame, M.D.        09/30/2016 8:58 AM

## 2016-09-30 NOTE — Evaluation (Signed)
Physical Therapy Evaluation Patient Details Name: Sarah James MRN: [id number] DOB: [birth year] Today's Date: 09/30/2016   History of Present Illness  Pt is a 65 year old female s/p L direct anterior THA  Clinical Impression  Pt is s/p L THA resulting in the deficits listed below (see PT Problem List).  Pt will benefit from skilled PT to increase their independence and safety with mobility to allow discharge to the venue listed below.  Pt tolerated short distance ambulation well POD#0 and anticipates d/c home with spouse.     Follow Up Recommendations Outpatient PT (per surgeon)    Equipment Recommendations  None recommended by PT    Recommendations for Other Services       Precautions / Restrictions Precautions Precautions: None Restrictions Other Position/Activity Restrictions: WBAT      Mobility  Bed Mobility Overal bed mobility: Needs Assistance Bed Mobility: Supine to Sit     Supine to sit: Min guard     General bed mobility comments: verbal cues for technique and managing lines  Transfers Overall transfer level: Needs assistance Equipment used: Rolling walker (2 wheeled) Transfers: Sit to/from Stand Sit to Stand: Min guard         General transfer comment: verbal cues for safety, pt attempting to stand upon getting to EOB prior to RW placement  Ambulation/Gait Ambulation/Gait assistance: Min guard Ambulation Distance (Feet): 40 Feet Assistive device: Rolling walker (2 wheeled) Gait Pattern/deviations: Step-to pattern;Antalgic     General Gait Details: verbal cues for technique, RW positioning and step length, distance limited to nausea  Stairs            Wheelchair Mobility    Modified Rankin (Stroke Patients Only)       Balance                                             Pertinent Vitals/Pain Pain Assessment: 0-10 Pain Score: 7  Pain Location: L hip with mobility Pain Descriptors / Indicators: Sore;Aching Pain  Intervention(s): Limited activity within patient's tolerance;Monitored during session;Premedicated before session;Repositioned;Ice applied    Home Living Family/patient expects to be discharged to:: Private residence Living Arrangements: Spouse/significant other   Type of Home: House Home Access: Stairs to enter Entrance Stairs-Rails: Right Entrance Stairs-Number of Steps: 2 Home Layout: One level Home Equipment: Environmental consultant - 2 wheels;Bedside commode      Prior Function Level of Independence: Independent         Comments: was playing tennis last week     Hand Dominance        Extremity/Trunk Assessment               Lower Extremity Assessment: LLE deficits/detail   LLE Deficits / Details: functional hip weakness observed as anticipated s/p hip surgery     Communication   Communication: No difficulties  Cognition Arousal/Alertness: Awake/alert Behavior During Therapy: WFL for tasks assessed/performed Overall Cognitive Status: Within Functional Limits for tasks assessed                      General Comments      Exercises     Assessment/Plan    PT Assessment Patient needs continued PT services  PT Problem List Decreased strength;Decreased knowledge of use of DME;Pain;Decreased mobility          PT Treatment Interventions Functional mobility training;Stair training;Gait training;DME instruction;Therapeutic  activities;Therapeutic exercise;Patient/family education    PT Goals (Current goals can be found in the Care Plan section)  Acute Rehab PT Goals PT Goal Formulation: With patient Time For Goal Achievement: 10/03/16 Potential to Achieve Goals: Good    Frequency 7X/week   Barriers to discharge        Co-evaluation               End of Session Equipment Utilized During Treatment: Gait belt Activity Tolerance: Patient tolerated treatment well Patient left: in bed;with call bell/phone within reach;with nursing/sitter in room Nurse  Communication: Mobility status         Time: 1451-1505 PT Time Calculation (min) (ACUTE ONLY): 14 min   Charges:   PT Evaluation $PT Eval Low Complexity: 1 Procedure     PT G Codes:        Sarah James,Sarah James 09/30/2016, 3:31 PM Sarah James, PT, DPT 09/30/2016 Pager: [phone]

## 2016-09-30 NOTE — Anesthesia Postprocedure Evaluation (Signed)
Anesthesia Post Note  Patient: Sarah James  Procedure(s) Performed: Procedure(s) (LRB): LEFT TOTAL HIP ARTHROPLASTY ANTERIOR APPROACH (Left)  Patient location during evaluation: PACU Anesthesia Type: Spinal Level of consciousness: awake Pain management: satisfactory to patient Vital Signs Assessment: post-procedure vital signs reviewed and stable Respiratory status: spontaneous breathing Cardiovascular status: blood pressure returned to baseline Postop Assessment: no headache and spinal receding Anesthetic complications: no    Last Vitals:  Vitals:   09/30/16 1253 09/30/16 1348  BP: (!) 99/57 (!) 96/58  Pulse: 63 96  Resp: 16 16  Temp: 36.4 C 36.6 C    Last Pain:  Vitals:   09/30/16 1511  TempSrc:   PainSc: Craig

## 2016-09-30 NOTE — Anesthesia Procedure Notes (Addendum)
Spinal  Patient location during procedure: OR Start time: 09/30/2016 7:25 AM Staffing Resident/CRNA: Dion Saucier E Performed: resident/CRNA  Preanesthetic Checklist Completed: patient identified, site marked, surgical consent, pre-op evaluation, timeout performed, IV checked, risks and benefits discussed and monitors and equipment checked Spinal Block Patient position: sitting Prep: ChloraPrep and site prepped and draped Patient monitoring: heart rate, continuous pulse ox and blood pressure Approach: midline Location: L2-3 Injection technique: single-shot Needle Needle type: Spinocan  Needle gauge: 22 G Needle length: 9 cm Additional Notes Kit expiration date checked. Negative heme, paresthesias.  Attempted 24g sprotte/introducer unable to locate space.  To 22 spinocan Tolerated well.

## 2016-09-30 NOTE — Interval H&P Note (Signed)
History and Physical Interval Note:  09/30/2016 7:14 AM  Sarah James  has presented today for surgery, with the diagnosis of left hip osteoarthritis  The various methods of treatment have been discussed with the patient and family. After consideration of risks, benefits and other options for treatment, the patient has consented to  Procedure(s): LEFT TOTAL HIP ARTHROPLASTY ANTERIOR APPROACH (Left) as a surgical intervention .  The patient's history has been reviewed, patient examined, no change in status, stable for surgery.  I have reviewed the patient's chart and labs.  Questions were answered to the patient's satisfaction.     Mauri Pole

## 2016-10-01 LAB — CBC
HEMATOCRIT: 29.9 % — AB (ref 36.0–46.0)
Hemoglobin: 9.8 g/dL — ABNORMAL LOW (ref 12.0–15.0)
MCH: 29.6 pg (ref 26.0–34.0)
MCHC: 32.8 g/dL (ref 30.0–36.0)
MCV: 90.3 fL (ref 78.0–100.0)
Platelets: 285 10*3/uL (ref 150–400)
RBC: 3.31 MIL/uL — ABNORMAL LOW (ref 3.87–5.11)
RDW: 13.6 % (ref 11.5–15.5)
WBC: 9.9 10*3/uL (ref 4.0–10.5)

## 2016-10-01 LAB — BASIC METABOLIC PANEL
Anion gap: 6 (ref 5–15)
BUN: 15 mg/dL (ref 6–20)
CALCIUM: 8.9 mg/dL (ref 8.9–10.3)
CHLORIDE: 108 mmol/L (ref 101–111)
CO2: 26 mmol/L (ref 22–32)
CREATININE: 0.55 mg/dL (ref 0.44–1.00)
GFR calc non Af Amer: >60 mL/min (ref >60)
Glucose, Bld: 103 mg/dL — ABNORMAL HIGH (ref 65–99)
Potassium: 4.3 mmol/L (ref 3.5–5.1)
Sodium: 140 mmol/L (ref 135–145)

## 2016-10-01 MED ORDER — DOCUSATE SODIUM 100 MG PO CAPS: 100.0000 mg | ORAL_CAPSULE | Freq: Two times a day (BID) | ORAL | 0 refills | Status: DC

## 2016-10-01 MED ORDER — METHOCARBAMOL 500 MG PO TABS: 500.0000 mg | ORAL_TABLET | Freq: Four times a day (QID) | ORAL | 0 refills | Status: DC | PRN

## 2016-10-01 MED ORDER — HYDROCODONE-ACETAMINOPHEN 7.5-325 MG PO TABS: 1.0000 | ORAL_TABLET | ORAL | 0 refills | Status: DC | PRN

## 2016-10-01 MED ORDER — ASPIRIN 81 MG PO CHEW: 81.0000 mg | CHEWABLE_TABLET | Freq: Two times a day (BID) | ORAL | 0 refills | Status: AC

## 2016-10-01 MED ORDER — FERROUS SULFATE 325 (65 FE) MG PO TABS: 325.0000 mg | ORAL_TABLET | Freq: Three times a day (TID) | ORAL | 3 refills | Status: DC

## 2016-10-01 MED ORDER — POLYETHYLENE GLYCOL 3350 17 G PO PACK: 17.0000 g | PACK | Freq: Two times a day (BID) | ORAL | 0 refills | Status: DC

## 2016-10-01 NOTE — Progress Notes (Signed)
     Subjective: 1 Day Post-Op Procedure(s) (LRB): LEFT TOTAL HIP ARTHROPLASTY ANTERIOR APPROACH (Left)   Patient reports pain as mild, pain controlled.  No events throughout the night. Ready to be discharged home.   Objective:   VITALS:   Vitals:   10/01/16 0625 10/01/16 0628  BP: (!) 109/59 108/72  Pulse: 73 70  Resp: 16 16  Temp:      Dorsiflexion/Plantar flexion intact Incision: dressing C/D/I No cellulitis present Compartment soft  LABS  Recent Labs  10/01/16 0423  HGB 9.8*  HCT 29.9*  WBC 9.9  PLT 285     Recent Labs  10/01/16 0423  NA 140  K 4.3  BUN 15  CREATININE 0.55  GLUCOSE 103*     Assessment/Plan: 1 Day Post-Op Procedure(s) (LRB): LEFT TOTAL HIP ARTHROPLASTY ANTERIOR APPROACH (Left) Foley cath d/c'ed Advance diet Up with therapy D/C IV fluids Discharge home with home health Follow up in 2 weeks at [facility]. Follow up with OLIN,Tinamarie Przybylski D in 2 weeks.  Contact information:  [facility] [address], [place] [id number]    Obese (BMI 30-39.9) Estimated body mass index is 30.95 kg/m as calculated from the following:   Height as of this encounter: 5\' 5"  (1.651 m).   Weight as of this encounter: 84.4 kg (186 lb). Patient also counseled that weight may inhibit the healing process Patient counseled that losing weight will help with future health issues       West Pugh. Loreta Blouch   PAC  10/01/2016, 8:28 AM

## 2016-10-01 NOTE — Progress Notes (Signed)
Physical Therapy Treatment Patient Details Name: Sarah James MRN: [id number] DOB: 1951/10/26 Today's Date: 2016-10-22    History of Present Illness Pt is a 65 year old female s/p L direct anterior THA    PT Comments    Pt mobilizing well however presents with "loopy" behavior - likely from meds.  Pt's spouse and daughter present during session.  Follow Up Recommendations  Outpatient PT (per surgeon)     Equipment Recommendations  None recommended by PT    Recommendations for Other Services       Precautions / Restrictions Precautions Precautions: None Restrictions Weight Bearing Restrictions: No LLE Weight Bearing: Weight bearing as tolerated    Mobility  Bed Mobility Overal bed mobility: Modified Independent       Supine to sit: Supervision     General bed mobility comments: increased time, HOB elevated  Transfers Overall transfer level: Needs assistance Equipment used: Rolling walker (2 wheeled) Transfers: Sit to/from Stand Sit to Stand: Min guard         General transfer comment: verbal cues for hand placement  Ambulation/Gait Ambulation/Gait assistance: Min guard Ambulation Distance (Feet): 200 Feet Assistive device: Rolling walker (2 wheeled) Gait Pattern/deviations: Step-through pattern;Trunk flexed     General Gait Details: verbal cues for RW positioning   Stairs Stairs: Yes Stairs assistance: Min guard Stair Management: One rail Right;Step to pattern;Forwards Number of Stairs: 2 General stair comments: verbal cues for safety and technique  Wheelchair Mobility    Modified Rankin (Stroke Patients Only)       Balance                                    Cognition Arousal/Alertness: Awake/alert Behavior During Therapy: WFL for tasks assessed/performed Overall Cognitive Status: Within Functional Limits for tasks assessed                      Exercises Total Joint Exercises Ankle Circles/Pumps:  AROM;Both;10 reps Long Arc Quad: AROM;Left;10 reps;Seated Marching in Standing: AROM;10 reps;Seated;Left    General Comments        Pertinent Vitals/Pain Pain Assessment: No/denies pain Pain Score: 1  Pain Location: L hip Pain Descriptors / Indicators: Sore Pain Intervention(s): Limited activity within patient's tolerance;Monitored during session;Repositioned;Patient requesting pain meds-RN notified;Ice applied    Home Living Family/patient expects to be discharged to:: Private residence Living Arrangements: Spouse/significant other           Home Equipment: Environmental consultant - 2 wheels;Bedside commode      Prior Function Level of Independence: Independent      Comments: was playing tennis last week   PT Goals (current goals can now be found in the care plan section) Progress towards PT goals: Progressing toward goals    Frequency    7X/week      PT Plan Current plan remains appropriate    Co-evaluation             End of Session Equipment Utilized During Treatment: Gait belt Activity Tolerance: Patient tolerated treatment well Patient left: with call bell/phone within reach;in chair;with family/visitor present     Time: [id number] PT Time Calculation (min) (ACUTE ONLY): 19 min  Charges:  $Gait Training: 8-22 mins                    G Codes:      Sarah James,Sarah James 10-22-16, 10:12 AM Sarah James, PT, DPT 2016/10/22  Pager: [phone]

## 2016-10-01 NOTE — Care Management Note (Signed)
Case Management Note  Patient Details  Name: Sarah James MRN: [id number] Date of Birth: [birth year]  Subjective/Objective:  65 y.o. F admitted 09/30/2016 for L THA. Has RW and 3n1. NO HH. Anticipate discharge later today. No further CM needs.                   Action/Plan: Discharge.   Expected Discharge Date:                  Expected Discharge Plan:     In-House Referral:     Discharge planning Services     Post Acute Care Choice:    Choice offered to:     DME Arranged:    DME Agency:     HH Arranged:    HH Agency:     Status of Service:     If discussed at [employer], dates discussed:    Additional Comments:  Delrae Sawyers, RN 10/01/2016, 9:58 AM

## 2016-10-01 NOTE — Evaluation (Signed)
Occupational Therapy Evaluation Patient Details Name: Sarah James MRN: [id number] DOB: March 15, 1951 Today's Date: 10/01/2016    History of Present Illness Pt is a 65 year old female s/p L direct anterior THA   Clinical Impression   Pt was admitted for the above. All education was completed. No further OT is needed at this time    Follow Up Recommendations  No OT follow up;Supervision/Assistance - 24 hour    Equipment Recommendations  None recommended by OT (has 3:1)    Recommendations for Other Services       Precautions / Restrictions Precautions Precautions: None Restrictions LLE Weight Bearing: Weight bearing as tolerated      Mobility Bed Mobility         Supine to sit: Supervision     General bed mobility comments: extra time; HOB raised  Transfers   Equipment used: Rolling walker (2 wheeled)   Sit to Stand: Min guard         General transfer comment: cues for UE placement    Balance                                            ADL Overall ADL's : Needs assistance/impaired     Grooming: Oral care;Supervision/safety;Standing       Lower Body Bathing: Minimal assistance;Sit to/from stand       Lower Body Dressing: Minimal assistance;Sit to/from stand   Toilet Transfer: Min guard;Transfer board;BSC;RW   Toileting- Water quality scientist and Hygiene: Min guard;Sit to/from stand   Tub/ Shower Transfer: Min guard;Walk-in shower;Ambulation     General ADL Comments: practiced bathroom transfers; cues for safety with RW when sidestepping.  Pt tends to move quickly.  Educated on use of 3:1 in shower; she has a Psychologist, educational      Pertinent Vitals/Pain Pain Score: 1  Pain Location: L hip Pain Descriptors / Indicators: Sore Pain Intervention(s): Limited activity within patient's tolerance;Monitored during session;Premedicated before session;Repositioned     Hand Dominance      Extremity/Trunk Assessment Upper Extremity Assessment Upper Extremity Assessment: Overall WFL for tasks assessed           Communication Communication Communication: No difficulties   Cognition Arousal/Alertness: Awake/alert Behavior During Therapy: WFL for tasks assessed/performed Overall Cognitive Status: Within Functional Limits for tasks assessed                     General Comments       Exercises       Shoulder Instructions      Home Living Family/patient expects to be discharged to:: Private residence Living Arrangements: Spouse/significant other                 Bathroom Shower/Tub: Occupational psychologist: Standard     Home Equipment: Environmental consultant - 2 wheels;Bedside commode          Prior Functioning/Environment Level of Independence: Independent        Comments: was playing tennis last week        OT Problem List:     OT Treatment/Interventions:      OT Goals(Current goals can be found in the care plan section) Acute Rehab OT Goals OT Goal Formulation: All assessment and education complete, DC therapy  OT Frequency:  Barriers to D/C:            Co-evaluation              End of Session    Activity Tolerance: Patient tolerated treatment well Patient left: in bed;with call bell/phone within reach   Time: 0824-0848 OT Time Calculation (min): 24 min Charges:  OT General Charges $OT Visit: 1 Procedure OT Evaluation $OT Eval Low Complexity: 1 Procedure G-Codes:    Nicola Heinemann 10/15/2016, 8:52 AM Lesle Chris, OTR/L [phone] 10/15/16

## 2016-10-12 NOTE — Discharge Summary (Signed)
Physician Discharge Summary  Patient ID: Sarah James MRN: [id number] DOB/AGE: April 03, 1951 65 y.o.  Admit date: 09/30/2016 Discharge date: 10/01/2016   Procedures:  Procedure(s) (LRB): LEFT TOTAL HIP ARTHROPLASTY ANTERIOR APPROACH (Left)  Attending Physician:  Dr. Paralee Cancel   Admission Diagnoses:   Left hip primary OA / pain  Discharge Diagnoses:  Principal Problem:   S/P left THA, AA  Past Medical History:  Diagnosis Date  . ADD (attention deficit disorder)   . Arthritis   . Depression   . Heartburn   . Hypothyroidism     HPI:    Sarah James, 65 y.o. female, has a history of pain and functional disability in the left hip(s) due to arthritis and patient has failed non-surgical conservative treatments for greater than 12 weeks to include NSAID's and/or analgesics, corticosteriod injections and activity modification.  Onset of symptoms was gradual starting 1+ years ago with gradually worsening course since that time.The patient noted no past surgery on the left hip(s).  Patient currently rates pain in the left hip at 9 out of 10 with activity. Patient has worsening of pain with activity and weight bearing, trendelenberg gait, pain that interfers with activities of daily living and pain with passive range of motion. Patient has evidence of periarticular osteophytes and joint space narrowing by imaging studies. This condition presents safety issues increasing the risk of falls. There is no current active infection.   Risks, benefits and expectations were discussed with the patient.  Risks including but not limited to the risk of anesthesia, blood clots, nerve damage, blood vessel damage, failure of the prosthesis, infection and up to and including death.  Patient understand the risks, benefits and expectations and wishes to proceed with surgery.   PCP: Eloise Levels, NP   Discharged Condition: good  Hospital Course:  Patient underwent the above stated procedure on 09/30/2016.  Patient tolerated the procedure well and brought to the recovery room in good condition and subsequently to the floor.  POD #1 BP: 108/72 ; Pulse: 70 ;  Resp: 16 Patient reports pain as mild, pain controlled.  No events throughout the night. Ready to be discharged home. Dorsiflexion/plantar flexion intact, incision: dressing C/D/I, no cellulitis present and compartment soft.   LABS  Basename    HGB     9.8  HCT     29.9    Discharge Exam: General appearance: alert, cooperative and no distress Extremities: Homans sign is negative, no sign of DVT, no edema, redness or tenderness in the calves or thighs and no ulcers, gangrene or trophic changes  Disposition: Home with follow up in 2 weeks   Follow-up Information    Mauri Pole, MD. Schedule an appointment as soon as possible for a visit in 2 week(s).   Specialty:  Orthopedic Surgery Contact information: [address] [place] [address] [id number]           Discharge Instructions    Call MD / Call 911    Complete by:  As directed    If you experience chest pain or shortness of breath, CALL 911 and be transported to the hospital emergency room.  If you develope a fever above 101 F, pus (white drainage) or increased drainage or redness at the wound, or calf pain, call your surgeon's office.   Change dressing    Complete by:  As directed    Maintain surgical dressing until follow up in the clinic. If the edges start to pull up, may reinforce  with tape. If the dressing is no longer working, may remove and cover with gauze and tape, but must keep the area dry and clean.  Call with any questions or concerns.   Constipation Prevention    Complete by:  As directed    Drink plenty of fluids.  Prune juice may be helpful.  You may use a stool softener, such as Colace (over the counter) 100 mg twice a day.  Use MiraLax (over the counter) for constipation as needed.   Diet - low sodium heart healthy    Complete by:   As directed    Discharge instructions    Complete by:  As directed    Maintain surgical dressing until follow up in the clinic. If the edges start to pull up, may reinforce with tape. If the dressing is no longer working, may remove and cover with gauze and tape, but must keep the area dry and clean.  Follow up in 2 weeks at [facility]. Call with any questions or concerns.   Increase activity slowly as tolerated    Complete by:  As directed    Weight bearing as tolerated with assist device (walker, cane, etc) as directed, use it as long as suggested by your surgeon or therapist, typically at least 4-6 weeks.   TED hose    Complete by:  As directed    Use stockings (TED hose) for 2 weeks on both leg(s).  You may remove them at night for sleeping.        Medication List    STOP taking these medications   acetaminophen 500 MG tablet Commonly known as:  TYLENOL   meloxicam 7.5 MG tablet Commonly known as:  MOBIC     TAKE these medications   amphetamine-dextroamphetamine 25 MG 24 hr capsule Commonly known as:  ADDERALL XR Take 25 mg by mouth every morning.   aspirin 81 MG chewable tablet Chew 1 tablet (81 mg total) by mouth 2 (two) times daily. Take for 4 weeks.   BLUE-EMU SUPER STRENGTH EX Apply 1 application topically 3 times/day as needed-between meals & bedtime.   buPROPion 150 MG 24 hr tablet Commonly known as:  WELLBUTRIN XL Take 1 tablet by mouth daily.   docusate sodium 100 MG capsule Commonly known as:  COLACE Take 1 capsule (100 mg total) by mouth 2 (two) times daily.   ferrous sulfate 325 (65 FE) MG tablet Take 1 tablet (325 mg total) by mouth 3 (three) times daily after meals.   FLUoxetine 10 MG capsule Commonly known as:  PROZAC Take 10 mg by mouth daily.   HYDROcodone-acetaminophen 7.5-325 MG tablet Commonly known as:  NORCO Take 1-2 tablets by mouth every 4 (four) hours as needed for moderate pain.   levothyroxine 88 MCG tablet Commonly  known as:  SYNTHROID, LEVOTHROID Take 88 mcg by mouth daily before breakfast.   methocarbamol 500 MG tablet Commonly known as:  ROBAXIN Take 1 tablet (500 mg total) by mouth every 6 (six) hours as needed for muscle spasms.   MOISTURE EYES PROTECT OP Apply 1 drop to eye daily as needed.   multivitamin with minerals tablet Take 1 tablet by mouth daily.   polyethylene glycol packet Commonly known as:  MIRALAX / GLYCOLAX Take 17 g by mouth 2 (two) times daily.        Signed: [place] Pugh. Tashona Calk   PA-C  10/12/2016, 9:57 PM

## 2016-10-15 DIAGNOSIS — Z471 Aftercare following joint replacement surgery: Secondary | ICD-10-CM | POA: Diagnosis not present

## 2016-10-15 DIAGNOSIS — Z96642 Presence of left artificial hip joint: Secondary | ICD-10-CM | POA: Diagnosis not present

## 2016-10-23 DIAGNOSIS — R350 Frequency of micturition: Secondary | ICD-10-CM | POA: Diagnosis not present

## 2016-11-14 DIAGNOSIS — M170 Bilateral primary osteoarthritis of knee: Secondary | ICD-10-CM | POA: Diagnosis not present

## 2016-11-14 DIAGNOSIS — Z96642 Presence of left artificial hip joint: Secondary | ICD-10-CM | POA: Diagnosis not present

## 2016-11-14 DIAGNOSIS — Z471 Aftercare following joint replacement surgery: Secondary | ICD-10-CM | POA: Diagnosis not present

## 2016-12-03 DIAGNOSIS — R635 Abnormal weight gain: Secondary | ICD-10-CM | POA: Diagnosis not present

## 2016-12-03 DIAGNOSIS — N951 Menopausal and female climacteric states: Secondary | ICD-10-CM | POA: Diagnosis not present

## 2016-12-04 DIAGNOSIS — R635 Abnormal weight gain: Secondary | ICD-10-CM | POA: Diagnosis not present

## 2016-12-04 DIAGNOSIS — Z6834 Body mass index (BMI) 34.0-34.9, adult: Secondary | ICD-10-CM | POA: Diagnosis not present

## 2016-12-04 DIAGNOSIS — E539 Vitamin B deficiency, unspecified: Secondary | ICD-10-CM | POA: Diagnosis not present

## 2016-12-04 DIAGNOSIS — N951 Menopausal and female climacteric states: Secondary | ICD-10-CM | POA: Diagnosis not present

## 2016-12-04 DIAGNOSIS — E039 Hypothyroidism, unspecified: Secondary | ICD-10-CM | POA: Diagnosis not present

## 2016-12-09 DIAGNOSIS — E039 Hypothyroidism, unspecified: Secondary | ICD-10-CM | POA: Diagnosis not present

## 2016-12-09 DIAGNOSIS — E539 Vitamin B deficiency, unspecified: Secondary | ICD-10-CM | POA: Diagnosis not present

## 2016-12-09 DIAGNOSIS — Z6834 Body mass index (BMI) 34.0-34.9, adult: Secondary | ICD-10-CM | POA: Diagnosis not present

## 2016-12-09 DIAGNOSIS — N951 Menopausal and female climacteric states: Secondary | ICD-10-CM | POA: Diagnosis not present

## 2016-12-16 DIAGNOSIS — Z6834 Body mass index (BMI) 34.0-34.9, adult: Secondary | ICD-10-CM | POA: Diagnosis not present

## 2016-12-16 DIAGNOSIS — E039 Hypothyroidism, unspecified: Secondary | ICD-10-CM | POA: Diagnosis not present

## 2016-12-16 DIAGNOSIS — E539 Vitamin B deficiency, unspecified: Secondary | ICD-10-CM | POA: Diagnosis not present

## 2016-12-17 ENCOUNTER — Ambulatory Visit (INDEPENDENT_AMBULATORY_CARE_PROVIDER_SITE_OTHER): Payer: BLUE CROSS/BLUE SHIELD | Admitting: Orthopaedic Surgery

## 2016-12-18 DIAGNOSIS — D2371 Other benign neoplasm of skin of right lower limb, including hip: Secondary | ICD-10-CM | POA: Diagnosis not present

## 2016-12-18 DIAGNOSIS — D1801 Hemangioma of skin and subcutaneous tissue: Secondary | ICD-10-CM | POA: Diagnosis not present

## 2016-12-18 DIAGNOSIS — L821 Other seborrheic keratosis: Secondary | ICD-10-CM | POA: Diagnosis not present

## 2016-12-18 DIAGNOSIS — D224 Melanocytic nevi of scalp and neck: Secondary | ICD-10-CM | POA: Diagnosis not present

## 2016-12-18 DIAGNOSIS — L814 Other melanin hyperpigmentation: Secondary | ICD-10-CM | POA: Diagnosis not present

## 2016-12-23 DIAGNOSIS — Z6833 Body mass index (BMI) 33.0-33.9, adult: Secondary | ICD-10-CM | POA: Diagnosis not present

## 2016-12-23 DIAGNOSIS — E039 Hypothyroidism, unspecified: Secondary | ICD-10-CM | POA: Diagnosis not present

## 2016-12-23 DIAGNOSIS — E539 Vitamin B deficiency, unspecified: Secondary | ICD-10-CM | POA: Diagnosis not present

## 2016-12-30 DIAGNOSIS — E039 Hypothyroidism, unspecified: Secondary | ICD-10-CM | POA: Diagnosis not present

## 2016-12-30 DIAGNOSIS — Z6832 Body mass index (BMI) 32.0-32.9, adult: Secondary | ICD-10-CM | POA: Diagnosis not present

## 2016-12-30 DIAGNOSIS — E539 Vitamin B deficiency, unspecified: Secondary | ICD-10-CM | POA: Diagnosis not present

## 2017-01-06 DIAGNOSIS — E539 Vitamin B deficiency, unspecified: Secondary | ICD-10-CM | POA: Diagnosis not present

## 2017-01-06 DIAGNOSIS — Z6832 Body mass index (BMI) 32.0-32.9, adult: Secondary | ICD-10-CM | POA: Diagnosis not present

## 2017-01-06 DIAGNOSIS — E039 Hypothyroidism, unspecified: Secondary | ICD-10-CM | POA: Diagnosis not present

## 2017-01-07 DIAGNOSIS — H5203 Hypermetropia, bilateral: Secondary | ICD-10-CM | POA: Diagnosis not present

## 2017-01-07 DIAGNOSIS — H2513 Age-related nuclear cataract, bilateral: Secondary | ICD-10-CM | POA: Diagnosis not present

## 2017-01-13 DIAGNOSIS — Z6832 Body mass index (BMI) 32.0-32.9, adult: Secondary | ICD-10-CM | POA: Diagnosis not present

## 2017-01-13 DIAGNOSIS — E039 Hypothyroidism, unspecified: Secondary | ICD-10-CM | POA: Diagnosis not present

## 2017-01-13 DIAGNOSIS — E539 Vitamin B deficiency, unspecified: Secondary | ICD-10-CM | POA: Diagnosis not present

## 2017-01-20 DIAGNOSIS — Z6832 Body mass index (BMI) 32.0-32.9, adult: Secondary | ICD-10-CM | POA: Diagnosis not present

## 2017-01-20 DIAGNOSIS — E039 Hypothyroidism, unspecified: Secondary | ICD-10-CM | POA: Diagnosis not present

## 2017-01-20 DIAGNOSIS — E539 Vitamin B deficiency, unspecified: Secondary | ICD-10-CM | POA: Diagnosis not present

## 2017-01-27 DIAGNOSIS — E539 Vitamin B deficiency, unspecified: Secondary | ICD-10-CM | POA: Diagnosis not present

## 2017-01-27 DIAGNOSIS — E039 Hypothyroidism, unspecified: Secondary | ICD-10-CM | POA: Diagnosis not present

## 2017-01-27 DIAGNOSIS — Z6832 Body mass index (BMI) 32.0-32.9, adult: Secondary | ICD-10-CM | POA: Diagnosis not present

## 2017-02-02 DIAGNOSIS — F900 Attention-deficit hyperactivity disorder, predominantly inattentive type: Secondary | ICD-10-CM | POA: Diagnosis not present

## 2017-02-02 DIAGNOSIS — F329 Major depressive disorder, single episode, unspecified: Secondary | ICD-10-CM | POA: Diagnosis not present

## 2017-02-02 DIAGNOSIS — G4700 Insomnia, unspecified: Secondary | ICD-10-CM | POA: Diagnosis not present

## 2017-02-02 DIAGNOSIS — E039 Hypothyroidism, unspecified: Secondary | ICD-10-CM | POA: Diagnosis not present

## 2017-02-03 DIAGNOSIS — Z6832 Body mass index (BMI) 32.0-32.9, adult: Secondary | ICD-10-CM | POA: Diagnosis not present

## 2017-02-03 DIAGNOSIS — E539 Vitamin B deficiency, unspecified: Secondary | ICD-10-CM | POA: Diagnosis not present

## 2017-02-03 DIAGNOSIS — E039 Hypothyroidism, unspecified: Secondary | ICD-10-CM | POA: Diagnosis not present

## 2017-02-17 DIAGNOSIS — E539 Vitamin B deficiency, unspecified: Secondary | ICD-10-CM | POA: Diagnosis not present

## 2017-02-17 DIAGNOSIS — E039 Hypothyroidism, unspecified: Secondary | ICD-10-CM | POA: Diagnosis not present

## 2017-02-17 DIAGNOSIS — Z6831 Body mass index (BMI) 31.0-31.9, adult: Secondary | ICD-10-CM | POA: Diagnosis not present

## 2017-02-24 DIAGNOSIS — Z6831 Body mass index (BMI) 31.0-31.9, adult: Secondary | ICD-10-CM | POA: Diagnosis not present

## 2017-02-24 DIAGNOSIS — E539 Vitamin B deficiency, unspecified: Secondary | ICD-10-CM | POA: Diagnosis not present

## 2017-02-24 DIAGNOSIS — E039 Hypothyroidism, unspecified: Secondary | ICD-10-CM | POA: Diagnosis not present

## 2017-03-18 DIAGNOSIS — E539 Vitamin B deficiency, unspecified: Secondary | ICD-10-CM | POA: Diagnosis not present

## 2017-03-18 DIAGNOSIS — Z6831 Body mass index (BMI) 31.0-31.9, adult: Secondary | ICD-10-CM | POA: Diagnosis not present

## 2017-03-18 DIAGNOSIS — E039 Hypothyroidism, unspecified: Secondary | ICD-10-CM | POA: Diagnosis not present

## 2017-03-19 DIAGNOSIS — M25562 Pain in left knee: Secondary | ICD-10-CM | POA: Diagnosis not present

## 2017-03-19 DIAGNOSIS — G8929 Other chronic pain: Secondary | ICD-10-CM | POA: Diagnosis not present

## 2017-03-19 DIAGNOSIS — M1711 Unilateral primary osteoarthritis, right knee: Secondary | ICD-10-CM | POA: Diagnosis not present

## 2017-03-19 DIAGNOSIS — M1712 Unilateral primary osteoarthritis, left knee: Secondary | ICD-10-CM | POA: Diagnosis not present

## 2017-03-19 DIAGNOSIS — M170 Bilateral primary osteoarthritis of knee: Secondary | ICD-10-CM | POA: Diagnosis not present

## 2017-03-19 DIAGNOSIS — M25561 Pain in right knee: Secondary | ICD-10-CM | POA: Diagnosis not present

## 2017-04-08 DIAGNOSIS — Z6831 Body mass index (BMI) 31.0-31.9, adult: Secondary | ICD-10-CM | POA: Diagnosis not present

## 2017-04-08 DIAGNOSIS — E039 Hypothyroidism, unspecified: Secondary | ICD-10-CM | POA: Diagnosis not present

## 2017-04-08 DIAGNOSIS — E539 Vitamin B deficiency, unspecified: Secondary | ICD-10-CM | POA: Diagnosis not present

## 2017-04-15 DIAGNOSIS — Z6831 Body mass index (BMI) 31.0-31.9, adult: Secondary | ICD-10-CM | POA: Diagnosis not present

## 2017-04-15 DIAGNOSIS — R5383 Other fatigue: Secondary | ICD-10-CM | POA: Diagnosis not present

## 2017-04-15 DIAGNOSIS — E039 Hypothyroidism, unspecified: Secondary | ICD-10-CM | POA: Diagnosis not present

## 2017-05-11 DIAGNOSIS — E039 Hypothyroidism, unspecified: Secondary | ICD-10-CM | POA: Diagnosis not present

## 2017-05-11 DIAGNOSIS — Z6831 Body mass index (BMI) 31.0-31.9, adult: Secondary | ICD-10-CM | POA: Diagnosis not present

## 2017-05-26 DIAGNOSIS — Z6831 Body mass index (BMI) 31.0-31.9, adult: Secondary | ICD-10-CM | POA: Diagnosis not present

## 2017-05-26 DIAGNOSIS — E039 Hypothyroidism, unspecified: Secondary | ICD-10-CM | POA: Diagnosis not present

## 2017-05-27 DIAGNOSIS — F900 Attention-deficit hyperactivity disorder, predominantly inattentive type: Secondary | ICD-10-CM | POA: Diagnosis not present

## 2017-05-27 DIAGNOSIS — E039 Hypothyroidism, unspecified: Secondary | ICD-10-CM | POA: Diagnosis not present

## 2017-05-27 DIAGNOSIS — Z79899 Other long term (current) drug therapy: Secondary | ICD-10-CM | POA: Diagnosis not present

## 2017-05-27 DIAGNOSIS — F321 Major depressive disorder, single episode, moderate: Secondary | ICD-10-CM | POA: Diagnosis not present

## 2017-05-27 DIAGNOSIS — G4700 Insomnia, unspecified: Secondary | ICD-10-CM | POA: Diagnosis not present

## 2017-07-21 DIAGNOSIS — I8312 Varicose veins of left lower extremity with inflammation: Secondary | ICD-10-CM | POA: Diagnosis not present

## 2017-07-24 DIAGNOSIS — I8312 Varicose veins of left lower extremity with inflammation: Secondary | ICD-10-CM | POA: Diagnosis not present

## 2017-07-27 ENCOUNTER — Encounter: Payer: Self-pay | Admitting: Family Medicine

## 2017-07-27 ENCOUNTER — Ambulatory Visit (INDEPENDENT_AMBULATORY_CARE_PROVIDER_SITE_OTHER): Payer: Medicare Other | Admitting: Family Medicine

## 2017-07-27 VITALS — BP 130/70 | HR 74 | Resp 12 | Ht 65.0 in | Wt 184.1 lb

## 2017-07-27 DIAGNOSIS — F988 Other specified behavioral and emotional disorders with onset usually occurring in childhood and adolescence: Secondary | ICD-10-CM

## 2017-07-27 DIAGNOSIS — R011 Cardiac murmur, unspecified: Secondary | ICD-10-CM | POA: Diagnosis not present

## 2017-07-27 DIAGNOSIS — G4700 Insomnia, unspecified: Secondary | ICD-10-CM | POA: Diagnosis not present

## 2017-07-27 DIAGNOSIS — F324 Major depressive disorder, single episode, in partial remission: Secondary | ICD-10-CM | POA: Diagnosis not present

## 2017-07-27 DIAGNOSIS — E039 Hypothyroidism, unspecified: Secondary | ICD-10-CM | POA: Diagnosis not present

## 2017-07-27 MED ORDER — BUPROPION HCL ER (XL) 150 MG PO TB24: 150.0000 mg | ORAL_TABLET | Freq: Every day | ORAL | 0 refills | Status: AC

## 2017-07-27 MED ORDER — AMPHETAMINE-DEXTROAMPHET ER 25 MG PO CP24: 25.0000 mg | ORAL_CAPSULE | ORAL | 0 refills | Status: DC

## 2017-07-27 NOTE — Patient Instructions (Addendum)
A few things to remember from today's visit:   Heart murmur on physical examination - Plan: EKG 12-Lead  Attention deficit disorder, unspecified hyperactivity presence - Plan: amphetamine-dextroamphetamine (ADDERALL XR) 25 MG 24 hr capsule, buPROPion (WELLBUTRIN XL) 150 MG 24 hr tablet  Insomnia, unspecified type  Depression, major, single episode, in partial remission ([place]) - Plan: buPROPion (WELLBUTRIN XL) 150 MG 24 hr tablet  I recommend trying just the Adderal XR, the one in the afternoon may be aggravating insomnia.  [place] [facility] is an option. Also psychiatrists manage more complex cases of ADD.  You could try just with Adderal long acting.  No changes in the rest of medications.    Please be sure medication list is accurate. If a new problem present, please set up appointment sooner than planned today.

## 2017-07-27 NOTE — Progress Notes (Signed)
HPI:   Sarah James is a 66 y.o. female, who is here today to establish care.  Former PCP: Sarah James , at [facility], [place] Last preventive routine visit: 02/2016.  Chronic medical problems: Osteoarthritis, depression, colon polyps, and thyroid disease among some.  She does exercise regularly and tries to follow a healthy diet. She lives with her husband. Colonoscopy reported as done in 2017. Last mammogram October 2017, per patient report.  Former smoker, quit about 25 years ago.    Concerns today: Medication refill.  She is reporting history of ADD, diagnosed about 14 years ago, at the same time her daughter was diagnosed. Currently she is on Adderall Xr 25 mg am and Adderall 15 mg daily pm. She takes Adderall XR when she first gets up, usually around 6 AM. She takes Adderall 15 mg around 4:30 PM.   According to patient, she needs to take medication daily otherwise she "can't not function." She is retired, states that sometimes she works once per week. She is her mother's caregiver.  History of insomnia, she is currently on Trazodone 50 mg at bedtime. She sleeps about 6-8 hours. She states that sometimes she just had at 6 AM but other times she does so around 11 AM. She denies side effects from medications.  Depression: Currently she is on Wellbutrin XL 150 mg daily. She is also taking Prevacid 15 Prozac 10 mg daily for anxiety. She denies side effects from these medications, she denies suicidal thoughts. Currently she is under some stress due to her mother's health issues. She feels like medication is helping.  Hypothyroidism: Currently she is on Levothyroxine 88 g daily. Last TSH done on 05/27/2017, it was 2.1. She denies abnormal weight loss, tremor, palpitations, constipation/diarrhea, cold/heat intolerance.   Review of Systems  Constitutional: Positive for fatigue. Negative for activity change, appetite change, fever and unexpected weight change.    HENT: Negative for mouth sores, nosebleeds, sore throat and trouble swallowing.   Eyes: Negative for redness and visual disturbance.  Respiratory: Negative for cough, shortness of breath and wheezing.   Cardiovascular: Negative for chest pain, palpitations and leg swelling.  Gastrointestinal: Negative for abdominal pain, nausea and vomiting.       Negative for changes in bowel habits.  Endocrine: Negative for cold intolerance and heat intolerance.  Genitourinary: Negative for decreased urine volume, dysuria and hematuria.  Musculoskeletal: Positive for arthralgias. Negative for gait problem.  Skin: Negative for pallor and rash.  Neurological: Negative for syncope, weakness and headaches.  Psychiatric/Behavioral: Positive for decreased concentration and sleep disturbance. Negative for confusion, hallucinations and suicidal ideas. The patient is nervous/anxious.      Current Outpatient Prescriptions on File Prior to Visit  Medication Sig Dispense Refill  . FLUoxetine (PROZAC) 10 MG capsule Take 10 mg by mouth daily.    Marland Kitchen levothyroxine (SYNTHROID, LEVOTHROID) 88 MCG tablet Take 88 mcg by mouth daily before breakfast.     No current facility-administered medications on file prior to visit.      Past Medical History:  Diagnosis Date  . ADD (attention deficit disorder)   . Arthritis   . Depression   . Heartburn   . Hypothyroidism    Past Surgical History:  Procedure Laterality Date  . deviated septum repair    . KNEE ARTHROSCOPY Right 2011  . TOTAL HIP ARTHROPLASTY Left 09/30/2016   Procedure: LEFT TOTAL HIP ARTHROPLASTY ANTERIOR APPROACH;  Surgeon: Paralee Cancel, MD;  Location: [facility];  Service: Orthopedics;  Laterality: Left;  Marland Kitchen VARICOSE VEIN SURGERY      No Known Allergies  Family History  Problem Relation Age of Onset  . Arthritis Mother   . Hypertension Mother   . Lung cancer Father   . Stroke Maternal Grandmother   . Hypertension Maternal Grandmother   . Heart  disease Paternal Grandfather     Social History   Social History  . Marital status: Married    Spouse name: N/A  . Number of children: N/A  . Years of education: N/A   Social History Main Topics  . Smoking status: Former Smoker    Quit date: 12/08/1990  . Smokeless tobacco: Former Systems developer  . Alcohol use 0.0 oz/week     Comment: 1 or 2 a month  . Drug use: No  . Sexual activity: Yes    Partners: Male    Birth control/ protection: Post-menopausal   Other Topics Concern  . None   Social History Narrative   Married.  Retired from [employer], now working at [employer].  Has 1 son in [place] (works for [employer]), daughter Maudie Mercury leaves in [place].  Originally from [place]    Vitals:   07/27/17 1359  BP: 130/70  Pulse: 74  Resp: 12  SpO2: 97%    Body mass index is 30.64 kg/m.  Physical Exam  Nursing note and vitals reviewed. Constitutional: She is oriented to person, place, and time. She appears well-developed. No distress.  HENT:  Head: Normocephalic and atraumatic.  Mouth/Throat: Oropharynx is clear and moist and mucous membranes are normal.  Eyes: Pupils are equal, round, and reactive to light. Conjunctivae and EOM are normal.  Neck: No tracheal deviation present. No thyroid mass and no thyromegaly present.  Cardiovascular: Normal rate and regular rhythm.   Murmur (SEM I/VI RUSB and LUSB) heard. Pulses:      Dorsalis pedis pulses are 2+ on the right side, and 2+ on the left side.  Varicose veins LE bilateral.  Respiratory: Effort normal and breath sounds normal. No respiratory distress.  GI: Soft. She exhibits no mass. There is no hepatomegaly. There is no tenderness.  Musculoskeletal: She exhibits no edema or tenderness.  Lymphadenopathy:    She has no cervical adenopathy.  Neurological: She is alert and oriented to person, place, and time. She has normal strength. Coordination and gait normal.  Skin: Skin is warm. No rash noted. No erythema.  Psychiatric: Her mood  appears anxious.  Well groomed, good eye contact.     ASSESSMENT AND PLAN:   Sarah. Ura was seen today for establish care.  Diagnoses and all orders for this visit:  Insomnia, unspecified type  He seems to be well controlled with Trazodone. We discussed some side effects of amphetamines, she does not think they are affecting her sleep. No changes in current management. Good sleep hygiene.  Heart murmur on physical examination  On examination today mild heart murmur, asymptomatic. EKG done today:SR, normal axis and intervals, normal. No other EKG available for comparison. I don't think further workup is better at this time, instructed about warning signs. I do not have records from former PCP at this time.   -     EKG 12-Lead  Attention deficit disorder, unspecified hyperactivity presence  Last Rx's filled on 06/25/17. We discussed some side effects of current medications. I recommend discontinuing p.m. Dose. Stopping pm dose may help with better sleep. Bottle of Adderall XR with 9 capsules, she doesn't have Adderall 15 mg with her  today. Recommend getting up around same time, avoid taking med after 11-12 noon because may interfere with sleep. Today I gave her Rx for Adderall XR 25 mg # 30/0. List of psychiatrists in the area as well as [facility] information given, so she can established.   -     amphetamine-dextroamphetamine (ADDERALL XR) 25 MG 24 hr capsule; Take 1 capsule by mouth every morning. Fill date 07/31/17 -     buPROPion (WELLBUTRIN XL) 150 MG 24 hr tablet; Take 1 tablet (150 mg total) by mouth daily.  Depression, major, single episode, in partial remission ([place])  And anxiety, both reported as stable and tolerating medications well. Plan discussed some side effects of current medications, no changes today. Follow-up in 3-4 months, before if needed.  -     buPROPion (WELLBUTRIN XL) 150 MG 24 hr tablet; Take 1 tablet (150 mg total) by mouth  daily.  Hypothyroidism, unspecified type  Stable. No changes in current management. We will recheck TSH in 4 months.     Betty G. Martinique, MD  [facility]. [place] office.

## 2017-07-30 DIAGNOSIS — M25562 Pain in left knee: Secondary | ICD-10-CM | POA: Diagnosis not present

## 2017-07-30 DIAGNOSIS — G8929 Other chronic pain: Secondary | ICD-10-CM | POA: Diagnosis not present

## 2017-07-30 DIAGNOSIS — M1712 Unilateral primary osteoarthritis, left knee: Secondary | ICD-10-CM | POA: Diagnosis not present

## 2017-07-30 DIAGNOSIS — M1711 Unilateral primary osteoarthritis, right knee: Secondary | ICD-10-CM | POA: Diagnosis not present

## 2017-07-30 DIAGNOSIS — M25561 Pain in right knee: Secondary | ICD-10-CM | POA: Diagnosis not present

## 2017-08-12 DIAGNOSIS — I8312 Varicose veins of left lower extremity with inflammation: Secondary | ICD-10-CM | POA: Diagnosis not present

## 2017-08-19 DIAGNOSIS — I8312 Varicose veins of left lower extremity with inflammation: Secondary | ICD-10-CM | POA: Diagnosis not present

## 2017-08-28 DIAGNOSIS — G8929 Other chronic pain: Secondary | ICD-10-CM | POA: Diagnosis not present

## 2017-08-28 DIAGNOSIS — M25561 Pain in right knee: Secondary | ICD-10-CM | POA: Diagnosis not present

## 2017-08-28 DIAGNOSIS — M25562 Pain in left knee: Secondary | ICD-10-CM | POA: Diagnosis not present

## 2017-08-28 DIAGNOSIS — M1711 Unilateral primary osteoarthritis, right knee: Secondary | ICD-10-CM | POA: Diagnosis not present

## 2017-09-07 DIAGNOSIS — E039 Hypothyroidism, unspecified: Secondary | ICD-10-CM | POA: Diagnosis not present

## 2017-09-07 DIAGNOSIS — Z79899 Other long term (current) drug therapy: Secondary | ICD-10-CM | POA: Diagnosis not present

## 2017-09-07 DIAGNOSIS — F321 Major depressive disorder, single episode, moderate: Secondary | ICD-10-CM | POA: Diagnosis not present

## 2017-09-07 DIAGNOSIS — M1711 Unilateral primary osteoarthritis, right knee: Secondary | ICD-10-CM | POA: Diagnosis not present

## 2017-09-07 DIAGNOSIS — G4700 Insomnia, unspecified: Secondary | ICD-10-CM | POA: Diagnosis not present

## 2017-09-07 DIAGNOSIS — Z01818 Encounter for other preprocedural examination: Secondary | ICD-10-CM | POA: Diagnosis not present

## 2017-09-07 DIAGNOSIS — Z23 Encounter for immunization: Secondary | ICD-10-CM | POA: Diagnosis not present

## 2017-09-07 DIAGNOSIS — F900 Attention-deficit hyperactivity disorder, predominantly inattentive type: Secondary | ICD-10-CM | POA: Diagnosis not present

## 2017-09-08 ENCOUNTER — Ambulatory Visit
Admission: RE | Admit: 2017-09-08 | Discharge: 2017-09-08 | Disposition: A | Discharge status: Home / Self Care | Payer: Medicare Other | Source: Ambulatory Visit | Attending: Family Medicine | Admitting: Family Medicine

## 2017-09-08 ENCOUNTER — Other Ambulatory Visit: Payer: Self-pay | Admitting: Family Medicine

## 2017-09-08 DIAGNOSIS — Z01818 Encounter for other preprocedural examination: Secondary | ICD-10-CM

## 2017-09-08 DIAGNOSIS — Z471 Aftercare following joint replacement surgery: Secondary | ICD-10-CM | POA: Diagnosis not present

## 2017-09-08 DIAGNOSIS — Z96659 Presence of unspecified artificial knee joint: Secondary | ICD-10-CM | POA: Diagnosis not present

## 2017-09-09 DIAGNOSIS — I8312 Varicose veins of left lower extremity with inflammation: Secondary | ICD-10-CM | POA: Diagnosis not present

## 2017-09-22 DIAGNOSIS — I8312 Varicose veins of left lower extremity with inflammation: Secondary | ICD-10-CM | POA: Diagnosis not present

## 2017-09-30 NOTE — H&P (Signed)
TOTAL KNEE ADMISSION H&P  Patient is being admitted for right total knee arthroplasty.  Subjective:  Chief Complaint:  Right knee primary OA / pain  HPI: Sarah James, 66 y.o. female, has a history of pain and functional disability in the right knee due to arthritis and has failed non-surgical conservative treatments for greater than 12 weeks to include NSAID's and/or analgesics, corticosteriod injections, viscosupplementation injections and activity modification.  Onset of symptoms was gradual, starting 2+ years ago with gradually worsening course since that time. The patient noted prior procedures on the knee to include  arthroscopy on the right knee(s).  Patient currently rates pain in the right knee(s) at 8 out of 10 with activity. Patient has worsening of pain with activity and weight bearing, pain that interferes with activities of daily living, pain with passive range of motion, crepitus and joint swelling.  Patient has evidence of periarticular osteophytes and joint space narrowing by imaging studies.  There is no active infection.   Risks, benefits and expectations were discussed with the patient.  Risks including but not limited to the risk of anesthesia, blood clots, nerve damage, blood vessel damage, failure of the prosthesis, infection and up to and including death.  Patient understand the risks, benefits and expectations and wishes to proceed with surgery.   PCP: Lujean Amel, MD  D/C Plans:       Home  Post-op Meds:       No Rx given   Tranexamic Acid:      To be given - IV   Decadron:      Is to be given  FYI:     ASA  [place]  DME:   Pt already has equipment   PT:   OPPT arranged at [place]   Patient Active Problem List   Diagnosis Date Noted  . ADD (attention deficit disorder) 07/27/2017  . Insomnia 07/27/2017  . Depression, major, single episode, in partial remission ([place]) 07/27/2017  . Hypothyroidism 07/27/2017  . S/P left THA, AA 09/30/2016   Past Medical  History:  Diagnosis Date  . ADD (attention deficit disorder)   . Arthritis   . Depression   . Heartburn   . Hypothyroidism     Past Surgical History:  Procedure Laterality Date  . deviated septum repair    . KNEE ARTHROSCOPY Right 2011  . TOTAL HIP ARTHROPLASTY Left 09/30/2016   Procedure: LEFT TOTAL HIP ARTHROPLASTY ANTERIOR APPROACH;  Surgeon: Paralee Cancel, MD;  Location: [facility] ORS;  Service: Orthopedics;  Laterality: Left;  [place] Kitchen VARICOSE VEIN SURGERY      No current facility-administered medications for this encounter.    Current Outpatient Prescriptions  Medication Sig Dispense Refill Last Dose  . amphetamine-dextroamphetamine (ADDERALL XR) 25 MG 24 hr capsule Take 1 capsule by mouth every morning. Fill date 07/31/17 30 capsule 0   . amphetamine-dextroamphetamine (ADDERALL) 15 MG tablet Take 15 mg by mouth daily.   Taking  . buPROPion (WELLBUTRIN XL) 150 MG 24 hr tablet Take 1 tablet (150 mg total) by mouth daily. 90 tablet 0   . FLUoxetine (PROZAC) 10 MG capsule Take 10 mg by mouth daily.   Taking  . levothyroxine (SYNTHROID, LEVOTHROID) 88 MCG tablet Take 88 mcg by mouth daily before breakfast.   Taking   No Known Allergies  Social History  Substance Use Topics  . Smoking status: Former Smoker    Quit date: 12/08/1990  . Smokeless tobacco: Former Systems developer  . Alcohol use 0.0 oz/week  Comment: 1 or 2 a month    Family History  Problem Relation Age of Onset  . Arthritis Mother   . Hypertension Mother   . Lung cancer Father   . Stroke Maternal Grandmother   . Hypertension Maternal Grandmother   . Heart disease Paternal Grandfather      Review of Systems  Constitutional: Negative.   HENT: Negative.   Eyes: Negative.   Respiratory: Negative.   Cardiovascular: Negative.   Gastrointestinal: Negative.   Genitourinary: Negative.   Musculoskeletal: Positive for joint pain.  Skin: Negative.   Neurological: Negative.   Endo/Heme/Allergies: Negative.   Psychiatric/Behavioral:  Positive for depression. The patient has insomnia.     Objective:  Physical Exam  Constitutional: She is oriented to person, place, and time. She appears well-developed.  HENT:  Head: Normocephalic.  Eyes: Pupils are equal, round, and reactive to light.  Neck: Neck supple. No JVD present. No tracheal deviation present. No thyromegaly present.  Cardiovascular: Normal rate, regular rhythm and intact distal pulses.   Respiratory: Effort normal and breath sounds normal. No respiratory distress. She has no wheezes.  GI: Soft. There is no tenderness. There is no guarding.  Musculoskeletal:       Right knee: She exhibits decreased range of motion, swelling, abnormal alignment and bony tenderness. She exhibits no ecchymosis, no deformity, no laceration and no erythema. Tenderness found.  Lymphadenopathy:    She has no cervical adenopathy.  Neurological: She is alert and oriented to person, place, and time.  Skin: Skin is warm and dry.  Psychiatric: She has a normal mood and affect.      Labs:  Estimated body mass index is 30.64 kg/m as calculated from the following:   Height as of 07/27/17: 5\' 5"  (1.651 m).   Weight as of 07/27/17: 83.5 kg (184 lb 2 oz).   Imaging Review Plain radiographs demonstrate severe degenerative joint disease of the right knee(s). The overall alignment is mild varus. The bone quality appears to be good for age and reported activity level.  Assessment/Plan:  End stage arthritis, right knee   The patient history, physical examination, clinical judgment of the provider and imaging studies are consistent with end stage degenerative joint disease of the right knee(s) and total knee arthroplasty is deemed medically necessary. The treatment options including medical management, injection therapy arthroscopy and arthroplasty were discussed at length. The risks and benefits of total knee arthroplasty were presented and reviewed. The risks due to aseptic loosening,  infection, stiffness, patella tracking problems, thromboembolic complications and other imponderables were discussed. The patient acknowledged the explanation, agreed to proceed with the plan and consent was signed. Patient is being admitted for inpatient treatment for surgery, pain control, PT, OT, prophylactic antibiotics, VTE prophylaxis, progressive ambulation and ADL's and discharge planning. The patient is planning to be discharged home.     [place] Steffi Noviello   PA-C  09/30/2017, 12:03 PM

## 2017-10-13 NOTE — Patient Instructions (Signed)
Sarah James  10/13/2017   Your procedure is scheduled on: 10/20/2017    Report to [facility] Main  Entrance .  Report to admitting at  0730 AM   Call this number if you have problems the morning of surgery  [phone]   Remember: ONLY 1 PERSON MAY GO WITH YOU TO SHORT STAY TO GET  READY MORNING OF [place].  Do not eat food or drink liquids :After Midnight.     Take these medicines the morning of surgery with A SIP OF WATER: Adderall, Wellbutrin, Prozac, Synthroid, Prilosec                                 You may not have any metal on your body including hair pins and              piercings  Do not wear jewelry, make-up, lotions, powders or perfumes, deodorant             Do not wear nail polish.  Do not shave  48 hours prior to surgery.                Do not bring valuables to the hospital. [place].  Contacts, dentures or bridgework may not be worn into surgery.  Leave suitcase in the car. After surgery it may be brought to your room.                   Please read over the following fact sheets you were given: _____________________________________________________________________             [facility] - Preparing for Surgery Before surgery, you can play an important role.  Because skin is not sterile, your skin needs to be as free of germs as possible.  You can reduce the number of germs on your skin by washing with CHG (chlorahexidine gluconate) soap before surgery.  CHG is an antiseptic cleaner which kills germs and bonds with the skin to continue killing germs even after washing. Please DO NOT use if you have an allergy to CHG or antibacterial soaps.  If your skin becomes reddened/irritated stop using the CHG and inform your nurse when you arrive at Short Stay. Do not shave (including legs and underarms) for at least 48 hours prior to the first CHG shower.  You may shave your  face/neck. Please follow these instructions carefully:  1.  Shower with CHG Soap the night before surgery and the  morning of Surgery.  2.  If you choose to wash your hair, wash your hair first as usual with your  normal  shampoo.  3.  After you shampoo, rinse your hair and body thoroughly to remove the  shampoo.                           4.  Use CHG as you would any other liquid soap.  You can apply chg directly  to the skin and wash                       Gently with a scrungie or clean washcloth.  5.  Apply the CHG Soap to your  body ONLY FROM THE NECK DOWN.   Do not use on face/ open                           Wound or open sores. Avoid contact with eyes, ears mouth and genitals (private parts).                       Wash face,  Genitals (private parts) with your normal soap.             6.  Wash thoroughly, paying special attention to the area where your surgery  will be performed.  7.  Thoroughly rinse your body with warm water from the neck down.  8.  DO NOT shower/wash with your normal soap after using and rinsing off  the CHG Soap.                9.  Pat yourself dry with a clean towel.            10.  Wear clean pajamas.            11.  Place clean sheets on your bed the night of your first shower and do not  sleep with pets. Day of Surgery : Do not apply any lotions/deodorants the morning of surgery.  Please wear clean clothes to the hospital/surgery center.  FAILURE TO FOLLOW THESE INSTRUCTIONS MAY RESULT IN THE CANCELLATION OF YOUR SURGERY PATIENT SIGNATURE_________________________________  NURSE SIGNATURE__________________________________  ________________________________________________________________________  WHAT IS A BLOOD TRANSFUSION? Blood Transfusion Information  A transfusion is the replacement of blood or some of its parts. Blood is made up of multiple cells which provide different functions.  Red blood cells carry oxygen and are used for blood loss  replacement.  White blood cells fight against infection.  Platelets control bleeding.  Plasma helps clot blood.  Other blood products are available for specialized needs, such as hemophilia or other clotting disorders. BEFORE THE TRANSFUSION  Who gives blood for transfusions?   Healthy volunteers who are fully evaluated to make sure their blood is safe. This is blood bank blood. Transfusion therapy is the safest it has ever been in the practice of medicine. Before blood is taken from a donor, a complete history is taken to make sure that person has no history of diseases nor engages in risky social behavior (examples are intravenous drug use or sexual activity with multiple partners). The donor's travel history is screened to minimize risk of transmitting infections, such as malaria. The donated blood is tested for signs of infectious diseases, such as HIV and hepatitis. The blood is then tested to be sure it is compatible with you in order to minimize the chance of a transfusion reaction. If you or a relative donates blood, this is often done in anticipation of surgery and is not appropriate for emergency situations. It takes many days to process the donated blood. RISKS AND COMPLICATIONS Although transfusion therapy is very safe and saves many lives, the main dangers of transfusion include:   Getting an infectious disease.  Developing a transfusion reaction. This is an allergic reaction to something in the blood you were given. Every precaution is taken to prevent this. The decision to have a blood transfusion has been considered carefully by your caregiver before blood is given. Blood is not given unless the benefits outweigh the risks. AFTER THE TRANSFUSION  Right after receiving a blood transfusion, you will usually feel  much better and more energetic. This is especially true if your red blood cells have gotten low (anemic). The transfusion raises the level of the red blood cells which  carry oxygen, and this usually causes an energy increase.  The nurse administering the transfusion will monitor you carefully for complications. HOME CARE INSTRUCTIONS  No special instructions are needed after a transfusion. You may find your energy is better. Speak with your caregiver about any limitations on activity for underlying diseases you may have. SEEK MEDICAL CARE IF:   Your condition is not improving after your transfusion.  You develop redness or irritation at the intravenous (IV) site. SEEK IMMEDIATE MEDICAL CARE IF:  Any of the following symptoms occur over the next 12 hours:  Shaking chills.  You have a temperature by mouth above 102 F (38.9 C), not controlled by medicine.  Chest, back, or muscle pain.  People around you feel you are not acting correctly or are confused.  Shortness of breath or difficulty breathing.  Dizziness and fainting.  You get a rash or develop hives.  You have a decrease in urine output.  Your urine turns a dark color or changes to pink, red, or brown. Any of the following symptoms occur over the next 10 days:  You have a temperature by mouth above 102 F (38.9 C), not controlled by medicine.  Shortness of breath.  Weakness after normal activity.  The white part of the eye turns yellow (jaundice).  You have a decrease in the amount of urine or are urinating less often.  Your urine turns a dark color or changes to pink, red, or brown. Document Released: 11/21/2000 Document Revised: 02/16/2012 Document Reviewed: 07/10/2008 ExitCare Patient Information 2014 [place].  _______________________________________________________________________  Incentive Spirometer  An incentive spirometer is a tool that can help keep your lungs clear and active. This tool measures how well you are filling your lungs with each breath. Taking long deep breaths may help reverse or decrease the chance of developing breathing (pulmonary) problems  (especially infection) following:  A long period of time when you are unable to move or be active. BEFORE THE PROCEDURE   If the spirometer includes an indicator to show your best effort, your nurse or respiratory therapist will set it to a desired goal.  If possible, sit up straight or lean slightly forward. Try not to slouch.  Hold the incentive spirometer in an upright position. INSTRUCTIONS FOR USE  1. Sit on the edge of your bed if possible, or sit up as far as you can in bed or on a chair. 2. Hold the incentive spirometer in an upright position. 3. Breathe out normally. 4. Place the mouthpiece in your mouth and seal your lips tightly around it. 5. Breathe in slowly and as deeply as possible, raising the piston or the ball toward the top of the column. 6. Hold your breath for 3-5 seconds or for as long as possible. Allow the piston or ball to fall to the bottom of the column. 7. Remove the mouthpiece from your mouth and breathe out normally. 8. Rest for a few seconds and repeat Steps 1 through 7 at least 10 times every 1-2 hours when you are awake. Take your time and take a few normal breaths between deep breaths. 9. The spirometer may include an indicator to show your best effort. Use the indicator as a goal to work toward during each repetition. 10. After each set of 10 deep breaths, practice coughing to be  sure your lungs are clear. If you have an incision (the cut made at the time of surgery), support your incision when coughing by placing a pillow or rolled up towels firmly against it. Once you are able to get out of bed, walk around indoors and cough well. You may stop using the incentive spirometer when instructed by your caregiver.  RISKS AND COMPLICATIONS  Take your time so you do not get dizzy or light-headed.  If you are in pain, you may need to take or ask for pain medication before doing incentive spirometry. It is harder to take a deep breath if you are having  pain. AFTER USE  Rest and breathe slowly and easily.  It can be helpful to keep track of a log of your progress. Your caregiver can provide you with a simple table to help with this. If you are using the spirometer at home, follow these instructions: [place] IF:   You are having difficultly using the spirometer.  You have trouble using the spirometer as often as instructed.  Your pain medication is not giving enough relief while using the spirometer.  You develop fever of 100.5 F (38.1 C) or higher. SEEK IMMEDIATE MEDICAL CARE IF:   You cough up bloody sputum that had not been present before.  You develop fever of 102 F (38.9 C) or greater.  You develop worsening pain at or near the incision site. MAKE SURE YOU:   Understand these instructions.  Will watch your condition.  Will get help right away if you are not doing well or get worse. Document Released: 04/06/2007 Document Revised: 02/16/2012 Document Reviewed: 06/07/2007 [facility] Patient Information 2014 [place], [place].   ________________________________________________________________________

## 2017-10-14 ENCOUNTER — Encounter (HOSPITAL_COMMUNITY)
Admission: RE | Admit: 2017-10-14 | Discharge: 2017-10-14 | Disposition: A | Discharge status: Home / Self Care | Payer: Medicare Other | Source: Ambulatory Visit | Attending: Orthopedic Surgery | Admitting: Orthopedic Surgery

## 2017-10-14 ENCOUNTER — Encounter (HOSPITAL_COMMUNITY): Payer: Self-pay

## 2017-10-14 ENCOUNTER — Other Ambulatory Visit: Payer: Self-pay

## 2017-10-14 DIAGNOSIS — Z8249 Family history of ischemic heart disease and other diseases of the circulatory system: Secondary | ICD-10-CM | POA: Diagnosis not present

## 2017-10-14 DIAGNOSIS — Z96642 Presence of left artificial hip joint: Secondary | ICD-10-CM | POA: Insufficient documentation

## 2017-10-14 DIAGNOSIS — Z8261 Family history of arthritis: Secondary | ICD-10-CM | POA: Diagnosis not present

## 2017-10-14 DIAGNOSIS — Z01812 Encounter for preprocedural laboratory examination: Secondary | ICD-10-CM | POA: Diagnosis not present

## 2017-10-14 DIAGNOSIS — Z87891 Personal history of nicotine dependence: Secondary | ICD-10-CM | POA: Diagnosis not present

## 2017-10-14 DIAGNOSIS — E039 Hypothyroidism, unspecified: Secondary | ICD-10-CM | POA: Insufficient documentation

## 2017-10-14 DIAGNOSIS — M1711 Unilateral primary osteoarthritis, right knee: Secondary | ICD-10-CM | POA: Insufficient documentation

## 2017-10-14 DIAGNOSIS — Z801 Family history of malignant neoplasm of trachea, bronchus and lung: Secondary | ICD-10-CM | POA: Diagnosis not present

## 2017-10-14 DIAGNOSIS — Z79899 Other long term (current) drug therapy: Secondary | ICD-10-CM | POA: Diagnosis not present

## 2017-10-14 DIAGNOSIS — Z0183 Encounter for blood typing: Secondary | ICD-10-CM | POA: Diagnosis not present

## 2017-10-14 DIAGNOSIS — F329 Major depressive disorder, single episode, unspecified: Secondary | ICD-10-CM | POA: Diagnosis not present

## 2017-10-14 HISTORY — DX: Anemia, unspecified: D64.9

## 2017-10-14 HISTORY — DX: Gastro-esophageal reflux disease without esophagitis: K21.9

## 2017-10-14 LAB — CBC
HEMATOCRIT: 37.5 % (ref 36.0–46.0)
Hemoglobin: 12.2 g/dL (ref 12.0–15.0)
MCH: 29.4 pg (ref 26.0–34.0)
MCHC: 32.5 g/dL (ref 30.0–36.0)
MCV: 90.4 fL (ref 78.0–100.0)
Platelets: 292 10*3/uL (ref 150–400)
RBC: 4.15 MIL/uL (ref 3.87–5.11)
RDW: 14.2 % (ref 11.5–15.5)
WBC: 5.7 10*3/uL (ref 4.0–10.5)

## 2017-10-14 LAB — SURGICAL PCR SCREEN
MRSA, PCR: NEGATIVE
Staphylococcus aureus: NEGATIVE

## 2017-10-14 NOTE — Progress Notes (Signed)
EKG-07/27/17-epic  Clearance- Dr Dorthy Cooler- 09/04/17 on chart.  CXR-09/09/17-epic

## 2017-10-20 ENCOUNTER — Other Ambulatory Visit: Payer: Self-pay

## 2017-10-20 ENCOUNTER — Encounter (HOSPITAL_COMMUNITY)
Admission: RE | Disposition: A | Discharge status: Home / Self Care | Payer: Self-pay | Source: Ambulatory Visit | Attending: Orthopedic Surgery

## 2017-10-20 ENCOUNTER — Encounter (HOSPITAL_COMMUNITY): Payer: Self-pay | Admitting: Anesthesiology

## 2017-10-20 ENCOUNTER — Inpatient Hospital Stay (HOSPITAL_COMMUNITY): Payer: Medicare Other | Admitting: Anesthesiology

## 2017-10-20 ENCOUNTER — Observation Stay (HOSPITAL_COMMUNITY)
Admission: RE | Admit: 2017-10-20 | Discharge: 2017-10-21 | Disposition: A | Discharge status: Home / Self Care | Payer: Medicare Other | Source: Ambulatory Visit | Attending: Orthopedic Surgery | Admitting: Orthopedic Surgery

## 2017-10-20 DIAGNOSIS — F988 Other specified behavioral and emotional disorders with onset usually occurring in childhood and adolescence: Secondary | ICD-10-CM | POA: Insufficient documentation

## 2017-10-20 DIAGNOSIS — Z801 Family history of malignant neoplasm of trachea, bronchus and lung: Secondary | ICD-10-CM | POA: Insufficient documentation

## 2017-10-20 DIAGNOSIS — M1711 Unilateral primary osteoarthritis, right knee: Principal | ICD-10-CM | POA: Insufficient documentation

## 2017-10-20 DIAGNOSIS — G8918 Other acute postprocedural pain: Secondary | ICD-10-CM | POA: Diagnosis not present

## 2017-10-20 DIAGNOSIS — K219 Gastro-esophageal reflux disease without esophagitis: Secondary | ICD-10-CM | POA: Diagnosis not present

## 2017-10-20 DIAGNOSIS — Z6830 Body mass index (BMI) 30.0-30.9, adult: Secondary | ICD-10-CM | POA: Diagnosis not present

## 2017-10-20 DIAGNOSIS — E669 Obesity, unspecified: Secondary | ICD-10-CM | POA: Diagnosis not present

## 2017-10-20 DIAGNOSIS — F329 Major depressive disorder, single episode, unspecified: Secondary | ICD-10-CM | POA: Diagnosis not present

## 2017-10-20 DIAGNOSIS — Z96659 Presence of unspecified artificial knee joint: Secondary | ICD-10-CM

## 2017-10-20 DIAGNOSIS — Z87891 Personal history of nicotine dependence: Secondary | ICD-10-CM | POA: Insufficient documentation

## 2017-10-20 DIAGNOSIS — Z96642 Presence of left artificial hip joint: Secondary | ICD-10-CM | POA: Diagnosis not present

## 2017-10-20 DIAGNOSIS — Z8261 Family history of arthritis: Secondary | ICD-10-CM | POA: Insufficient documentation

## 2017-10-20 DIAGNOSIS — Z8249 Family history of ischemic heart disease and other diseases of the circulatory system: Secondary | ICD-10-CM | POA: Diagnosis not present

## 2017-10-20 DIAGNOSIS — G4700 Insomnia, unspecified: Secondary | ICD-10-CM | POA: Diagnosis not present

## 2017-10-20 DIAGNOSIS — Z79899 Other long term (current) drug therapy: Secondary | ICD-10-CM | POA: Diagnosis not present

## 2017-10-20 DIAGNOSIS — Z823 Family history of stroke: Secondary | ICD-10-CM | POA: Diagnosis not present

## 2017-10-20 DIAGNOSIS — E039 Hypothyroidism, unspecified: Secondary | ICD-10-CM | POA: Diagnosis not present

## 2017-10-20 DIAGNOSIS — Z96651 Presence of right artificial knee joint: Secondary | ICD-10-CM

## 2017-10-20 DIAGNOSIS — Z7982 Long term (current) use of aspirin: Secondary | ICD-10-CM | POA: Insufficient documentation

## 2017-10-20 HISTORY — PX: TOTAL KNEE ARTHROPLASTY: SHX125

## 2017-10-20 LAB — TYPE AND SCREEN
ABO/RH(D): A NEG
Antibody Screen: NEGATIVE

## 2017-10-20 SURGERY — ARTHROPLASTY, KNEE, TOTAL
Anesthesia: Spinal | Site: Knee | Laterality: Right

## 2017-10-20 MED ORDER — AMPHETAMINE-DEXTROAMPHETAMINE 10 MG PO TABS
15.0000 mg | ORAL_TABLET | ORAL | Status: DC
  Administered 2017-10-20: 16:00:00 15 mg via ORAL
  Filled 2017-10-20 (×2): qty 2

## 2017-10-20 MED ORDER — ACETAMINOPHEN 650 MG RE SUPP: 650.0000 mg | RECTAL | Status: DC | PRN

## 2017-10-20 MED ORDER — ONDANSETRON HCL 4 MG PO TABS: 4.0000 mg | ORAL_TABLET | Freq: Four times a day (QID) | ORAL | Status: DC | PRN

## 2017-10-20 MED ORDER — ROPIVACAINE HCL 5 MG/ML IJ SOLN
INTRAMUSCULAR | Status: DC | PRN
  Administered 2017-10-20: 30 mL via PERINEURAL

## 2017-10-20 MED ORDER — TRANEXAMIC ACID 1000 MG/10ML IV SOLN
1000.0000 mg | INTRAVENOUS | Status: AC
  Administered 2017-10-20: 1000 mg via INTRAVENOUS
  Filled 2017-10-20: qty 1100

## 2017-10-20 MED ORDER — SODIUM CHLORIDE 0.9 % IR SOLN
Status: DC | PRN
  Administered 2017-10-20: 1000 mL

## 2017-10-20 MED ORDER — METHOCARBAMOL 500 MG PO TABS
500.0000 mg | ORAL_TABLET | Freq: Four times a day (QID) | ORAL | Status: DC | PRN
  Administered 2017-10-20 – 2017-10-21 (×2): 500 mg via ORAL
  Filled 2017-10-20 (×2): qty 1

## 2017-10-20 MED ORDER — LACTATED RINGERS IV SOLN: INTRAVENOUS | Status: DC

## 2017-10-20 MED ORDER — SODIUM CHLORIDE 0.9 % IV SOLN
INTRAVENOUS | Status: DC
  Administered 2017-10-20: 15:00:00 via INTRAVENOUS

## 2017-10-20 MED ORDER — OMEPRAZOLE MAGNESIUM 20 MG PO TBEC
20.0000 mg | DELAYED_RELEASE_TABLET | Freq: Every day | ORAL | Status: DC | PRN
  Filled 2017-10-20: qty 1

## 2017-10-20 MED ORDER — FENTANYL CITRATE (PF) 100 MCG/2ML IJ SOLN
INTRAMUSCULAR | Status: AC
  Administered 2017-10-20: 50 ug via INTRAVENOUS
  Filled 2017-10-20: qty 2

## 2017-10-20 MED ORDER — HYDROCODONE-ACETAMINOPHEN 7.5-325 MG PO TABS
2.0000 | ORAL_TABLET | ORAL | Status: DC | PRN
  Administered 2017-10-21 (×3): 2 via ORAL
  Filled 2017-10-20 (×3): qty 2

## 2017-10-20 MED ORDER — ALUM & MAG HYDROXIDE-SIMETH 200-200-20 MG/5ML PO SUSP: 15.0000 mL | ORAL | Status: DC | PRN

## 2017-10-20 MED ORDER — METOCLOPRAMIDE HCL 5 MG/ML IJ SOLN
5.0000 mg | Freq: Three times a day (TID) | INTRAMUSCULAR | Status: DC | PRN
Start: 2017-10-20 — End: 2017-10-21

## 2017-10-20 MED ORDER — MAGNESIUM CITRATE PO SOLN: 1.0000 | Freq: Once | ORAL | Status: DC | PRN

## 2017-10-20 MED ORDER — CEFAZOLIN SODIUM-DEXTROSE 2-4 GM/100ML-% IV SOLN
INTRAVENOUS | Status: AC
  Filled 2017-10-20: qty 100

## 2017-10-20 MED ORDER — MIDAZOLAM HCL 2 MG/2ML IJ SOLN
1.0000 mg | INTRAMUSCULAR | Status: DC | PRN
Start: 2017-10-20 — End: 2017-10-20
  Administered 2017-10-20: 2 mg via INTRAVENOUS

## 2017-10-20 MED ORDER — ONDANSETRON HCL 4 MG/2ML IJ SOLN: 4.0000 mg | Freq: Four times a day (QID) | INTRAMUSCULAR | Status: DC | PRN

## 2017-10-20 MED ORDER — BISACODYL 10 MG RE SUPP: 10.0000 mg | Freq: Every day | RECTAL | Status: DC | PRN

## 2017-10-20 MED ORDER — FERROUS SULFATE 325 (65 FE) MG PO TABS
325.0000 mg | ORAL_TABLET | Freq: Three times a day (TID) | ORAL | Status: DC
  Administered 2017-10-20 – 2017-10-21 (×3): 325 mg via ORAL
  Filled 2017-10-20 (×3): qty 1

## 2017-10-20 MED ORDER — ASPIRIN 81 MG PO CHEW
81.0000 mg | CHEWABLE_TABLET | Freq: Two times a day (BID) | ORAL | Status: DC
  Administered 2017-10-20 – 2017-10-21 (×2): 81 mg via ORAL
  Filled 2017-10-20 (×2): qty 1

## 2017-10-20 MED ORDER — FERROUS SULFATE 325 (65 FE) MG PO TABS: 325.0000 mg | ORAL_TABLET | Freq: Three times a day (TID) | ORAL | 3 refills | Status: DC

## 2017-10-20 MED ORDER — MEPERIDINE HCL 50 MG/ML IJ SOLN: 6.2500 mg | INTRAMUSCULAR | Status: DC | PRN

## 2017-10-20 MED ORDER — PHENYLEPHRINE 40 MCG/ML (10ML) SYRINGE FOR IV PUSH (FOR BLOOD PRESSURE SUPPORT)
PREFILLED_SYRINGE | INTRAVENOUS | Status: AC
  Filled 2017-10-20: qty 10

## 2017-10-20 MED ORDER — BUPIVACAINE HCL (PF) 0.75 % IJ SOLN
INTRAMUSCULAR | Status: DC | PRN
  Administered 2017-10-20: 15 mg via INTRATHECAL

## 2017-10-20 MED ORDER — BUPROPION HCL ER (XL) 150 MG PO TB24
150.0000 mg | ORAL_TABLET | Freq: Every day | ORAL | Status: DC
  Administered 2017-10-21: 09:00:00 150 mg via ORAL
  Filled 2017-10-20: qty 1

## 2017-10-20 MED ORDER — HYDROMORPHONE HCL 1 MG/ML IJ SOLN
0.5000 mg | INTRAMUSCULAR | Status: DC | PRN
  Administered 2017-10-20 (×2): 1 mg via INTRAVENOUS
  Filled 2017-10-20 (×2): qty 1

## 2017-10-20 MED ORDER — SODIUM CHLORIDE 0.9 % IJ SOLN
INTRAMUSCULAR | Status: DC | PRN
  Administered 2017-10-20: 29 mL

## 2017-10-20 MED ORDER — KETOROLAC TROMETHAMINE 30 MG/ML IJ SOLN
INTRAMUSCULAR | Status: AC
  Filled 2017-10-20: qty 1

## 2017-10-20 MED ORDER — FENTANYL CITRATE (PF) 100 MCG/2ML IJ SOLN
50.0000 ug | INTRAMUSCULAR | Status: DC | PRN
  Administered 2017-10-20 (×2): 50 ug via INTRAVENOUS

## 2017-10-20 MED ORDER — MULTI-VITAMIN DAILY PO TABS: 1.0000 | ORAL_TABLET | Freq: Every day | ORAL | Status: DC

## 2017-10-20 MED ORDER — MIDAZOLAM HCL 2 MG/2ML IJ SOLN
INTRAMUSCULAR | Status: AC
  Administered 2017-10-20: 2 mg via INTRAVENOUS
  Filled 2017-10-20: qty 2

## 2017-10-20 MED ORDER — CHLORHEXIDINE GLUCONATE 4 % EX LIQD: 60.0000 mL | Freq: Once | CUTANEOUS | Status: DC

## 2017-10-20 MED ORDER — DEXAMETHASONE SODIUM PHOSPHATE 10 MG/ML IJ SOLN
INTRAMUSCULAR | Status: AC
  Filled 2017-10-20: qty 1

## 2017-10-20 MED ORDER — DOCUSATE SODIUM 100 MG PO CAPS: 100.0000 mg | ORAL_CAPSULE | Freq: Two times a day (BID) | ORAL | 0 refills | Status: DC

## 2017-10-20 MED ORDER — LACTATED RINGERS IV SOLN
INTRAVENOUS | Status: DC
  Administered 2017-10-20 (×3): via INTRAVENOUS

## 2017-10-20 MED ORDER — STERILE WATER FOR IRRIGATION IR SOLN
Status: DC | PRN
  Administered 2017-10-20: 2000 mL

## 2017-10-20 MED ORDER — CEFAZOLIN SODIUM-DEXTROSE 2-4 GM/100ML-% IV SOLN
2.0000 g | Freq: Four times a day (QID) | INTRAVENOUS | Status: AC
  Administered 2017-10-20 (×2): 2 g via INTRAVENOUS
  Filled 2017-10-20 (×2): qty 100

## 2017-10-20 MED ORDER — DOCUSATE SODIUM 100 MG PO CAPS
100.0000 mg | ORAL_CAPSULE | Freq: Two times a day (BID) | ORAL | Status: DC
  Administered 2017-10-20 – 2017-10-21 (×2): 100 mg via ORAL
  Filled 2017-10-20 (×2): qty 1

## 2017-10-20 MED ORDER — PHENOL 1.4 % MT LIQD: 1.0000 | OROMUCOSAL | Status: DC | PRN

## 2017-10-20 MED ORDER — ACETAMINOPHEN 325 MG PO TABS
650.0000 mg | ORAL_TABLET | ORAL | Status: DC | PRN
  Filled 2017-10-20: qty 2

## 2017-10-20 MED ORDER — AMPHETAMINE-DEXTROAMPHET ER 5 MG PO CP24
25.0000 mg | ORAL_CAPSULE | Freq: Every day | ORAL | Status: DC
  Filled 2017-10-20: qty 1

## 2017-10-20 MED ORDER — 0.9 % SODIUM CHLORIDE (POUR BTL) OPTIME
TOPICAL | Status: DC | PRN
  Administered 2017-10-20: 1000 mL

## 2017-10-20 MED ORDER — HYDROCODONE-ACETAMINOPHEN 7.5-325 MG PO TABS: 1.0000 | ORAL_TABLET | ORAL | 0 refills | Status: DC | PRN

## 2017-10-20 MED ORDER — PROMETHAZINE HCL 25 MG/ML IJ SOLN: 6.2500 mg | INTRAMUSCULAR | Status: DC | PRN

## 2017-10-20 MED ORDER — HYDROMORPHONE HCL 1 MG/ML IJ SOLN
INTRAMUSCULAR | Status: AC
  Filled 2017-10-20: qty 1

## 2017-10-20 MED ORDER — PROPOFOL 500 MG/50ML IV EMUL
INTRAVENOUS | Status: DC | PRN
  Administered 2017-10-20: 100 ug/kg/min via INTRAVENOUS

## 2017-10-20 MED ORDER — METHOCARBAMOL 1000 MG/10ML IJ SOLN
500.0000 mg | Freq: Four times a day (QID) | INTRAVENOUS | Status: DC | PRN
  Administered 2017-10-20: 500 mg via INTRAVENOUS
  Filled 2017-10-20: qty 550

## 2017-10-20 MED ORDER — MENTHOL 3 MG MT LOZG: 1.0000 | LOZENGE | OROMUCOSAL | Status: DC | PRN

## 2017-10-20 MED ORDER — POLYETHYLENE GLYCOL 3350 17 G PO PACK: 17.0000 g | PACK | Freq: Two times a day (BID) | ORAL | 0 refills | Status: AC

## 2017-10-20 MED ORDER — DEXAMETHASONE SODIUM PHOSPHATE 10 MG/ML IJ SOLN
10.0000 mg | Freq: Once | INTRAMUSCULAR | Status: AC
  Administered 2017-10-21: 08:00:00 10 mg via INTRAVENOUS
  Filled 2017-10-20: qty 1

## 2017-10-20 MED ORDER — SODIUM CHLORIDE 0.9 % IJ SOLN
INTRAMUSCULAR | Status: AC
  Filled 2017-10-20: qty 50

## 2017-10-20 MED ORDER — ONDANSETRON HCL 4 MG/2ML IJ SOLN
INTRAMUSCULAR | Status: AC
  Filled 2017-10-20: qty 2

## 2017-10-20 MED ORDER — DEXAMETHASONE SODIUM PHOSPHATE 10 MG/ML IJ SOLN
INTRAMUSCULAR | Status: DC | PRN
  Administered 2017-10-20: 10 mg via INTRAVENOUS

## 2017-10-20 MED ORDER — BUPIVACAINE-EPINEPHRINE 0.25% -1:200000 IJ SOLN
INTRAMUSCULAR | Status: DC | PRN
  Administered 2017-10-20: 30 mL

## 2017-10-20 MED ORDER — ASPIRIN 81 MG PO CHEW: 81.0000 mg | CHEWABLE_TABLET | Freq: Two times a day (BID) | ORAL | 0 refills | Status: AC

## 2017-10-20 MED ORDER — HYDROCODONE-ACETAMINOPHEN 5-325 MG PO TABS
ORAL_TABLET | ORAL | Status: AC
  Administered 2017-10-20: 1
  Filled 2017-10-20: qty 1

## 2017-10-20 MED ORDER — HYDROMORPHONE HCL 1 MG/ML IJ SOLN
0.2500 mg | INTRAMUSCULAR | Status: DC | PRN
  Administered 2017-10-20 (×2): 0.5 mg via INTRAVENOUS

## 2017-10-20 MED ORDER — CEFAZOLIN SODIUM-DEXTROSE 2-4 GM/100ML-% IV SOLN
2.0000 g | INTRAVENOUS | Status: AC
  Administered 2017-10-20: 2 g via INTRAVENOUS

## 2017-10-20 MED ORDER — TRANEXAMIC ACID 1000 MG/10ML IV SOLN
1000.0000 mg | Freq: Once | INTRAVENOUS | Status: AC
  Administered 2017-10-20: 1000 mg via INTRAVENOUS
  Filled 2017-10-20: qty 1100

## 2017-10-20 MED ORDER — LEVOTHYROXINE SODIUM 88 MCG PO TABS
88.0000 ug | ORAL_TABLET | Freq: Every day | ORAL | Status: DC
  Administered 2017-10-21: 08:00:00 88 ug via ORAL
  Filled 2017-10-20: qty 1

## 2017-10-20 MED ORDER — FLUOXETINE HCL 10 MG PO CAPS
10.0000 mg | ORAL_CAPSULE | Freq: Every day | ORAL | Status: DC
  Administered 2017-10-21: 10 mg via ORAL
  Filled 2017-10-20: qty 1

## 2017-10-20 MED ORDER — PHENYLEPHRINE HCL 10 MG/ML IJ SOLN
INTRAMUSCULAR | Status: AC
  Filled 2017-10-20: qty 1

## 2017-10-20 MED ORDER — ONDANSETRON HCL 4 MG/2ML IJ SOLN
INTRAMUSCULAR | Status: DC | PRN
  Administered 2017-10-20: 4 mg via INTRAVENOUS

## 2017-10-20 MED ORDER — CELECOXIB 200 MG PO CAPS
200.0000 mg | ORAL_CAPSULE | Freq: Two times a day (BID) | ORAL | Status: DC
  Administered 2017-10-20 – 2017-10-21 (×2): 200 mg via ORAL
  Filled 2017-10-20 (×2): qty 1

## 2017-10-20 MED ORDER — HYDROCODONE-ACETAMINOPHEN 7.5-325 MG PO TABS
1.0000 | ORAL_TABLET | ORAL | Status: DC | PRN
  Administered 2017-10-20 (×2): 1 via ORAL
  Filled 2017-10-20 (×2): qty 1

## 2017-10-20 MED ORDER — METOCLOPRAMIDE HCL 5 MG PO TABS: 5.0000 mg | ORAL_TABLET | Freq: Three times a day (TID) | ORAL | Status: DC | PRN

## 2017-10-20 MED ORDER — BUPIVACAINE-EPINEPHRINE (PF) 0.25% -1:200000 IJ SOLN
INTRAMUSCULAR | Status: AC
  Filled 2017-10-20: qty 30

## 2017-10-20 MED ORDER — PHENYLEPHRINE HCL 10 MG/ML IJ SOLN
INTRAVENOUS | Status: DC | PRN
  Administered 2017-10-20: 50 ug/min via INTRAVENOUS

## 2017-10-20 MED ORDER — METHOCARBAMOL 500 MG PO TABS: 500.0000 mg | ORAL_TABLET | Freq: Four times a day (QID) | ORAL | 0 refills | Status: DC | PRN

## 2017-10-20 MED ORDER — POLYETHYLENE GLYCOL 3350 17 G PO PACK
17.0000 g | PACK | Freq: Two times a day (BID) | ORAL | Status: DC
  Administered 2017-10-21: 09:00:00 17 g via ORAL
  Filled 2017-10-20: qty 1

## 2017-10-20 MED ORDER — DIPHENHYDRAMINE HCL 12.5 MG/5ML PO ELIX
12.5000 mg | ORAL_SOLUTION | ORAL | Status: DC | PRN
  Administered 2017-10-20: 25 mg via ORAL
  Filled 2017-10-20: qty 10

## 2017-10-20 MED ORDER — KETOROLAC TROMETHAMINE 30 MG/ML IJ SOLN
INTRAMUSCULAR | Status: DC | PRN
  Administered 2017-10-20: 30 mg via INTRAMUSCULAR

## 2017-10-20 SURGICAL SUPPLY — 47 items
ADH SKN CLS APL DERMABOND .7 (GAUZE/BANDAGES/DRESSINGS) ×1
BAG DECANTER FOR FLEXI CONT (MISCELLANEOUS) IMPLANT
BAG SPEC THK2 15X12 ZIP CLS (MISCELLANEOUS)
BAG ZIPLOCK 12X15 (MISCELLANEOUS) IMPLANT
BANDAGE ACE 6X5 VEL STRL LF (GAUZE/BANDAGES/DRESSINGS) ×3 IMPLANT
BLADE SAW SGTL 11.0X1.19X90.0M (BLADE) IMPLANT
BLADE SAW SGTL 13.0X1.19X90.0M (BLADE) ×3 IMPLANT
BOWL SMART MIX CTS (DISPOSABLE) ×3 IMPLANT
CAPT KNEE TOTAL 3 ATTUNE ×2 IMPLANT
CEMENT HV SMART SET (Cement) ×4 IMPLANT
COVER SURGICAL LIGHT HANDLE (MISCELLANEOUS) ×3 IMPLANT
CUFF TOURN SGL QUICK 34 (TOURNIQUET CUFF) ×3
CUFF TRNQT CYL 34X4X40X1 (TOURNIQUET CUFF) ×1 IMPLANT
DECANTER SPIKE VIAL GLASS SM (MISCELLANEOUS) ×3 IMPLANT
DERMABOND ADVANCED (GAUZE/BANDAGES/DRESSINGS) ×2
DERMABOND ADVANCED .7 DNX12 (GAUZE/BANDAGES/DRESSINGS) ×1 IMPLANT
DRAPE U-SHAPE 47X51 STRL (DRAPES) ×3 IMPLANT
DRESSING AQUACEL AG SP 3.5X10 (GAUZE/BANDAGES/DRESSINGS) ×1 IMPLANT
DRSG AQUACEL AG SP 3.5X10 (GAUZE/BANDAGES/DRESSINGS) ×3
DURAPREP 26ML APPLICATOR (WOUND CARE) ×6 IMPLANT
ELECT REM PT RETURN 15FT ADLT (MISCELLANEOUS) ×3 IMPLANT
GLOVE BIOGEL M 7.0 STRL (GLOVE) IMPLANT
GLOVE BIOGEL PI IND STRL 7.5 (GLOVE) ×1 IMPLANT
GLOVE BIOGEL PI IND STRL 8.5 (GLOVE) ×1 IMPLANT
GLOVE BIOGEL PI INDICATOR 7.5 (GLOVE) ×4
GLOVE BIOGEL PI INDICATOR 8.5 (GLOVE) ×2
GLOVE ECLIPSE 8.0 STRL XLNG CF (GLOVE) ×3 IMPLANT
GLOVE ORTHO TXT STRL SZ7.5 (GLOVE) ×6 IMPLANT
GOWN STRL REUS W/TWL LRG LVL3 (GOWN DISPOSABLE) ×3 IMPLANT
GOWN STRL REUS W/TWL XL LVL3 (GOWN DISPOSABLE) ×3 IMPLANT
HANDPIECE INTERPULSE COAX TIP (DISPOSABLE) ×3
MANIFOLD NEPTUNE II (INSTRUMENTS) ×3 IMPLANT
PACK TOTAL KNEE CUSTOM (KITS) ×3 IMPLANT
POSITIONER SURGICAL ARM (MISCELLANEOUS) ×3 IMPLANT
SET HNDPC FAN SPRY TIP SCT (DISPOSABLE) ×1 IMPLANT
SET PAD KNEE POSITIONER (MISCELLANEOUS) ×3 IMPLANT
SUT MNCRL AB 4-0 PS2 18 (SUTURE) ×3 IMPLANT
SUT STRATAFIX 0 PDS 27 VIOLET (SUTURE) ×3
SUT VIC AB 1 CT1 36 (SUTURE) ×3 IMPLANT
SUT VIC AB 2-0 CT1 27 (SUTURE) ×9
SUT VIC AB 2-0 CT1 TAPERPNT 27 (SUTURE) ×3 IMPLANT
SUTURE STRATFX 0 PDS 27 VIOLET (SUTURE) ×1 IMPLANT
SYR 50ML LL SCALE MARK (SYRINGE) ×3 IMPLANT
TRAY FOLEY W/METER SILVER 16FR (SET/KITS/TRAYS/PACK) ×3 IMPLANT
WATER STERILE IRR 1000ML POUR (IV SOLUTION) ×3 IMPLANT
WRAP KNEE MAXI GEL POST OP (GAUZE/BANDAGES/DRESSINGS) ×3 IMPLANT
YANKAUER SUCT BULB TIP 10FT TU (MISCELLANEOUS) ×3 IMPLANT

## 2017-10-20 NOTE — Interval H&P Note (Signed)
History and Physical Interval Note:  10/20/2017 8:35 AM  Sarah James  has presented today for surgery, with the diagnosis of right knee osteoarthritis  The various methods of treatment have been discussed with the patient and family. After consideration of risks, benefits and other options for treatment, the patient has consented to  Procedure(s): RIGHT TOTAL KNEE ARTHROPLASTY (Right) as a surgical intervention .  The patient's history has been reviewed, patient examined, no change in status, stable for surgery.  I have reviewed the patient's chart and labs.  Questions were answered to the patient's satisfaction.     Mauri Pole

## 2017-10-20 NOTE — Anesthesia Preprocedure Evaluation (Addendum)
Anesthesia Evaluation  Patient identified by MRN, date of birth, ID band Patient awake    Reviewed: Allergy & Precautions, NPO status , Patient's Chart, lab work & pertinent test results  Airway Mallampati: II  TM Distance: >3 FB Neck ROM: Full    Dental  (+) Teeth Intact, Dental Advisory Given   Pulmonary former smoker,    breath sounds clear to auscultation       Cardiovascular  Rhythm:Regular Rate:Normal     Neuro/Psych PSYCHIATRIC DISORDERS Depression    GI/Hepatic GERD  Medicated,  Endo/Other  Hypothyroidism   Renal/GU      Musculoskeletal negative musculoskeletal ROS (+) Arthritis ,   Abdominal   Peds  Hematology   Anesthesia Other Findings   Reproductive/Obstetrics                            Lab Results  Component Value Date   WBC 5.7 10/14/2017   HGB 12.2 10/14/2017   HCT 37.5 10/14/2017   MCV 90.4 10/14/2017   PLT 292 10/14/2017   Lab Results  Component Value Date   CREATININE 0.55 10/01/2016   BUN 15 10/01/2016   NA 140 10/01/2016   K 4.3 10/01/2016   CL 108 10/01/2016   CO2 26 10/01/2016   EKG: normal sinus rhythm.  Anesthesia Physical Anesthesia Plan  ASA: II  Anesthesia Plan: Spinal   Post-op Pain Management:  Regional for Post-op pain   Induction: Intravenous  PONV Risk Score and Plan: 3 and Ondansetron, Dexamethasone, Propofol infusion and Midazolam  Airway Management Planned: Natural Airway and Nasal Cannula  Additional Equipment:   Intra-op Plan:   Post-operative Plan:   Informed Consent:   Plan Discussed with:   Anesthesia Plan Comments:         Anesthesia Quick Evaluation

## 2017-10-20 NOTE — Transfer of Care (Signed)
Immediate Anesthesia Transfer of Care Note  Patient: Sarah James  Procedure(s) Performed: RIGHT TOTAL KNEE ARTHROPLASTY (Right Knee)  Patient Location: PACU  Anesthesia Type:Regional and Spinal  Level of Consciousness: sedated  Airway & Oxygen Therapy: Patient Spontanous Breathing and Patient connected to face mask oxygen  Post-op Assessment: Report given to RN and Post -op Vital signs reviewed and stable  Post vital signs: Reviewed and stable  Last Vitals:  Vitals:   10/20/17 0907 10/20/17 0917  BP: 101/65 107/70  Pulse: (!) 59 62  Resp: 13 13  Temp:    SpO2: 100% 100%    Last Pain:  Vitals:   10/20/17 0917  TempSrc:   PainSc: Asleep         Complications: No apparent anesthesia complications

## 2017-10-20 NOTE — Op Note (Signed)
NAME:  Sarah James                      MEDICAL RECORD NO.:  [id number]                             FACILITY:  [facility]      PHYSICIAN:  Pietro Cassis. Alvan Dame, M.D.  DATE OF BIRTH:  07/31/1951      DATE OF PROCEDURE:  10/20/2017                                     OPERATIVE REPORT         PREOPERATIVE DIAGNOSIS:  Right knee osteoarthritis.      POSTOPERATIVE DIAGNOSIS:  Right knee osteoarthritis.      FINDINGS:  The patient was noted to have complete loss of cartilage and   bone-on-bone arthritis with associated osteophytes in the medial and patellofemoral compartments of   the knee with a significant synovitis, findings of complete ACL dysfunction and associated effusion.      PROCEDURE:  Right total knee replacement.      COMPONENTS USED:  DePuy Attune rotating platform posterior stabilized knee   system, a size 6 femur, 6 tibia, 7 mm PS AOX insert, and 35 anatomic patellar   button.      SURGEON:  Pietro Cassis. Alvan Dame, M.D.      ASSISTANT:  Danae Orleans, PA-C.      ANESTHESIA:  Regional and Spinal.      SPECIMENS:  None.      COMPLICATION:  None.      DRAINS:  None  EBL: <200cc      TOURNIQUET TIME:   Total Tourniquet Time Documented: Thigh (Right) - 29 minutes Total: Thigh (Right) - 29 minutes  .      The patient was stable to the recovery room.      INDICATION FOR PROCEDURE:  Sarah James is a 66 y.o. female patient of   mine.  The patient had been seen, evaluated, and treated conservatively in the   office with medication, activity modification, and injections.  The patient had   radiographic changes of bone-on-bone arthritis with endplate sclerosis and osteophytes noted.      The patient failed conservative measures including medication, injections, and activity modification, and at this point was ready for more definitive measures.   Based on the radiographic changes and failed conservative measures, the patient   decided to proceed with total knee replacement.   Risks of infection,   DVT, component failure, need for revision surgery, postop course, and   expectations were all   discussed and reviewed.  Consent was obtained for benefit of pain   relief.      PROCEDURE IN DETAIL:  The patient was brought to the operative theater.   Once adequate anesthesia, preoperative antibiotics, 2 gm of Ancef, 1 gm of Tranexamic Acid, and 10 mg of Decadron administered, the patient was positioned supine with the right thigh tourniquet placed.  The  right lower extremity was prepped and draped in sterile fashion.  A time-   out was performed identifying the patient, planned procedure, and   extremity.      The right lower extremity was placed in the [facility] leg holder.  The leg was   exsanguinated, tourniquet elevated to 250 mmHg.  A midline incision  was   made followed by median parapatellar arthrotomy.  Following initial   exposure, attention was first directed to the patella.  Precut   measurement was noted to be 24 mm.  I resected down to 14 mm and used a   35 anatomic patellar button to restore patellar height as well as cover the cut   surface.      The lug holes were drilled and a metal shim was placed to protect the   patella from retractors and saw blades.      At this point, attention was now directed to the femur.  The femoral   canal was opened with a drill, irrigated to try to prevent fat emboli.  An   intramedullary rod was passed at 3 degrees valgus, 9 mm of bone was   resected off the distal femur.  Following this resection, the tibia was   subluxated anteriorly.  Using the extramedullary guide, 2 mm of bone was resected off   the proximal medial tibia.  We confirmed the gap would be   stable medially and laterally with a size 5 spacer blcok as well as confirmed   the cut was perpendicular in the coronal plane, checking with an alignment rod.      Once this was done, I sized the femur to be a size 6 in the anterior-   posterior dimension,  chose a standard component based on medial and   lateral dimension.  The size 6 rotation block was then pinned in   position anterior referenced using the C-clamp to set rotation.  The   anterior, posterior, and  chamfer cuts were made without difficulty nor   notching making certain that I was along the anterior cortex to help   with flexion gap stability.      The final box cut was made off the lateral aspect of distal femur.      At this point, the tibia was sized to be a size 6, the size 6 tray was   then pinned in position through the medial third of the tubercle,   drilled, and keel punched.  Trial reduction was now carried with a 6 femur,  6 tibia, a size 6 then 7 mm PS insert, and the 35 anatomic patella botton.  The knee was brought to   extension, full extension with good flexion stability with the patella   tracking through the trochlea without application of pressure.  Given   all these findings the femoral lug holes were drilled and then the trial components removed.  At this point due to significant medial side wear and sclerotic bone I drilled holes in this bone to allow for cement interdigitation.  Final components were   opened and cement was mixed.  The knee was irrigated with normal saline   solution and pulse lavage.  The synovial lining was   then injected with 30 cc of 0.25% Marcaine with epinephrine and 1 cc of Toradol plus 30 cc of for a total of 61 cc.      The knee was irrigated.  Final implants were then cemented onto clean and   dried cut surfaces of bone with the knee brought to extension with a size 7 mm PS trial insert.      Once the cement had fully cured, the excess cement was removed   throughout the knee.  I confirmed I was satisfied with the range of   motion and stability, and the final 7  mm PS AOXinsert was chosen.  It was   placed into the knee.      The tourniquet had been let down at 29 minutes.  No significant   hemostasis required.  The    extensor mechanism was then reapproximated using #1 Vicryl and #0 Stratafix sutures with the knee   in flexion.  The   remaining wound was closed with 2-0 Vicryl and running 4-0 Monocryl.   The knee was cleaned, dried, dressed sterilely using Dermabond and   Aquacel dressing.  The patient was then   brought to recovery room in stable condition, tolerating the procedure   well.   Please note that Physician Assistant, Danae Orleans, PA-C, was present for the entirety of the case, and was utilized for pre-operative positioning, peri-operative retractor management, general facilitation of the procedure.  He was also utilized for primary wound closure at the end of the case.              Pietro Cassis Alvan Dame, M.D.    10/20/2017 10:45 AM

## 2017-10-20 NOTE — Evaluation (Signed)
Physical Therapy Evaluation Patient Details Name: Sarah James MRN: [id number] DOB: January 30, 1951 Today's Date: 10/20/2017   History of Present Illness  pt is 66 yo s/p RTKA today (10/20/2017) and s/p LDATHA Jan 2017  Clinical Impression  Pt is s/p TKA resulting in the deficits listed below (see PT Problem List).  Pt will benefit from acute PT to increase their independence and safety with mobility to allow discharge home safely.      Follow Up Recommendations Outpatient PT(OPPT set up at [facility] per chart )    Equipment Recommendations  None recommended by PT    Recommendations for Other Services       Precautions / Restrictions Precautions Precautions: Knee(educated pt with knee precuations and no pillow or bend in the knee while resting ) Restrictions Weight Bearing Restrictions: No      Mobility  Bed Mobility Overal bed mobility: Needs Assistance Bed Mobility: Supine to Sit;Sit to Supine     Supine to sit: Min guard     General bed mobility comments: min guard and cues for safety for this first day , otherwise moves well   Transfers Overall transfer level: Needs assistance Equipment used: Rolling walker (2 wheeled) Transfers: Sit to/from Stand Sit to Stand: Min guard         General transfer comment: cues for safety with RW use   Ambulation/Gait Ambulation/Gait assistance: Min guard Ambulation Distance (Feet): 50 Feet Assistive device: Rolling walker (2 wheeled) Gait Pattern/deviations: Step-to pattern     General Gait Details: tolerated ambulation well, cues for step to pattern today on first day, will progress nicely to step through pattern within next few days   Stairs            Wheelchair Mobility    Modified Rankin (Stroke Patients Only)       Balance Overall balance assessment: No apparent balance deficits (not formally assessed)                                           Pertinent Vitals/Pain Pain Assessment:  0-10 Pain Score: 2  Pain Location: R knee area Pain Descriptors / Indicators: Aching Pain Intervention(s): Monitored during session;Ice applied    Home Living Family/patient expects to be discharged to:: Private residence Living Arrangements: Spouse/significant other Available Help at Discharge: Family;Available 24 hours/day Type of Home: House Home Access: Stairs to enter Entrance Stairs-Rails: Right Entrance Stairs-Number of Steps: 3 Home Layout: Two level;Able to live on main level with bedroom/bathroom Home Equipment: Gilford Rile - 2 wheels;Bedside commode      Prior Function Level of Independence: Independent         Comments: enjoys being active and playing tennis      Hand Dominance        Extremity/Trunk Assessment        Lower Extremity Assessment Lower Extremity Assessment: RLE deficits/detail RLE Deficits / Details: grossly 3+/5, can SLR independently just needs help with AROM for knee flexion        Communication   Communication: No difficulties  Cognition Arousal/Alertness: Awake/alert Behavior During Therapy: WFL for tasks assessed/performed Overall Cognitive Status: Within Functional Limits for tasks assessed                                        General Comments  Exercises Total Joint Exercises Ankle Circles/Pumps: AROM;10 reps;Supine;Both Quad Sets: AROM;10 reps;Right;Supine Heel Slides: AAROM;Right;10 reps;Supine Straight Leg Raises: AAROM;10 reps;Right;Supine Goniometric ROM: grossly 0-70 supine    Assessment/Plan    PT Assessment Patient needs continued PT services  PT Problem List Decreased strength;Decreased range of motion;Decreased activity tolerance;Decreased mobility       PT Treatment Interventions Gait training;DME instruction;Therapeutic activities;Therapeutic exercise;Patient/family education;Stair training;Functional mobility training    PT Goals (Current goals can be found in the Care Plan section)   Acute Rehab PT Goals Patient Stated Goal: I want to get back to tennis  PT Goal Formulation: With patient Time For Goal Achievement: 10/27/17 Potential to Achieve Goals: Good    Frequency 7X/week   Barriers to discharge        Co-evaluation               AM-PAC PT "6 Clicks" Daily Activity  Outcome Measure Difficulty turning over in bed (including adjusting bedclothes, sheets and blankets)?: A Little Difficulty moving from lying on back to sitting on the side of the bed? : A Little Difficulty sitting down on and standing up from a chair with arms (e.g., wheelchair, bedside commode, etc,.)?: A Little Help needed moving to and from a bed to chair (including a wheelchair)?: A Little Help needed walking in hospital room?: A Little Help needed climbing 3-5 steps with a railing? : A Little 6 Click Score: 18    End of Session Equipment Utilized During Treatment: Gait belt Activity Tolerance: Patient tolerated treatment well Patient left: in chair;with call bell/phone within reach(pt agrees and understand sto call nurse if she has to move ) Nurse Communication: Mobility status PT Visit Diagnosis: Other abnormalities of gait and mobility (R26.89)    Time: 1443-1540 PT Time Calculation (min) (ACUTE ONLY): 24 min   Charges:   PT Evaluation $PT Eval Low Complexity: 1 Low PT Treatments $Gait Training: 8-22 mins   PT G Codes:   PT G-Codes **NOT FOR INPATIENT CLASS** Functional Assessment Tool Used: AM-PAC 6 Clicks Basic Mobility;Clinical judgement Functional Limitation: Mobility: Walking and moving around Mobility: Walking and Moving Around Current Status (G8676): At least 1 percent but less than 20 percent impaired, limited or restricted Mobility: Walking and Moving Around Goal Status [phone]): 0 percent impaired, limited or restricted    Clide Dales, PT Pager: [phone] 10/20/2017   Sarah James, Sarah James 10/20/2017, 8:09 PM

## 2017-10-20 NOTE — Progress Notes (Signed)
PT Note  Patient Details Name: Sarah James MRN: [id number] DOB: 11-30-1951   Pt seen for POD0 session, Tolerated well, full write up to follow.       Clide Dales 10/20/2017, 6:06 PM

## 2017-10-20 NOTE — Anesthesia Procedure Notes (Signed)
Anesthesia Regional Block: Adductor canal block   Pre-Anesthetic Checklist: ,, timeout performed, Correct Patient, Correct Site, Correct Laterality, Correct Procedure, Correct Position, site marked, Risks and benefits discussed,  Surgical consent,  Pre-op evaluation,  At surgeon's request and post-op pain management  Laterality: Right  Prep: chloraprep       Needles:  Injection technique: Single-shot  Needle Type: Echogenic Needle     Needle Length: 9cm  Needle Gauge: 21     Additional Needles:   Procedures:,,,, ultrasound used (permanent image in chart),,,,  Narrative:  Start time: 10/20/2017 8:40 AM End time: 10/20/2017 8:50 AM Injection made incrementally with aspirations every 5 mL.  Performed by: Personally  Anesthesiologist: Effie Berkshire, MD  Additional Notes: Patient tolerated the procedure well. Local anesthetic introduced in an incremental fashion under minimal resistance after negative aspirations. No paresthesias were elicited. After completion of the procedure, no acute issues were identified and patient continued to be monitored by RN.

## 2017-10-20 NOTE — Progress Notes (Signed)
Assisted Dr. Hollis with right, ultrasound guided, adductor canal block. Side rails up, monitors on throughout procedure. See vital signs in flow sheet. Tolerated Procedure well.  

## 2017-10-20 NOTE — Anesthesia Postprocedure Evaluation (Signed)
Anesthesia Post Note  Patient: Dyna Figuereo  Procedure(s) Performed: RIGHT TOTAL KNEE ARTHROPLASTY (Right Knee)     Patient location during evaluation: PACU Anesthesia Type: Spinal Level of consciousness: oriented and awake and alert Pain management: pain level controlled Vital Signs Assessment: post-procedure vital signs reviewed and stable Respiratory status: spontaneous breathing, respiratory function stable and patient connected to nasal cannula oxygen Cardiovascular status: blood pressure returned to baseline and stable Postop Assessment: no headache, no backache, no apparent nausea or vomiting, spinal receding and patient able to bend at knees Anesthetic complications: no    Last Vitals:  Vitals:   10/20/17 1334 10/20/17 1437  BP: 106/67 (!) 99/57  Pulse:  (!) 57  Resp: 14 16  Temp: 37.1 C 36.9 C  SpO2:  99%    Last Pain:  Vitals:   10/20/17 1437  TempSrc: Oral  PainSc:                  Effie Berkshire

## 2017-10-20 NOTE — Anesthesia Procedure Notes (Signed)
Spinal  Patient location during procedure: OR Start time: 10/20/2017 9:34 AM End time: 10/20/2017 9:36 AM Staffing Resident/CRNA: Lind Covert, CRNA Performed: resident/CRNA  Preanesthetic Checklist Completed: patient identified, site marked, surgical consent, pre-op evaluation, timeout performed, IV checked, risks and benefits discussed and monitors and equipment checked Spinal Block Patient position: sitting Prep: Betadine Patient monitoring: heart rate, cardiac monitor, continuous pulse ox and blood pressure Approach: midline Location: L3-4 Injection technique: single-shot Needle Needle type: Pencan  Needle gauge: 24 G Needle length: 10 cm Needle insertion depth: 7 cm Assessment Sensory level: T6 Additional Notes Timeout performed. SAB kit date checked. SAB without difficulty

## 2017-10-20 NOTE — Discharge Instructions (Signed)

## 2017-10-21 DIAGNOSIS — E039 Hypothyroidism, unspecified: Secondary | ICD-10-CM | POA: Diagnosis not present

## 2017-10-21 DIAGNOSIS — E669 Obesity, unspecified: Secondary | ICD-10-CM | POA: Diagnosis present

## 2017-10-21 DIAGNOSIS — M1711 Unilateral primary osteoarthritis, right knee: Secondary | ICD-10-CM | POA: Diagnosis not present

## 2017-10-21 DIAGNOSIS — K219 Gastro-esophageal reflux disease without esophagitis: Secondary | ICD-10-CM | POA: Diagnosis not present

## 2017-10-21 DIAGNOSIS — G4700 Insomnia, unspecified: Secondary | ICD-10-CM | POA: Diagnosis not present

## 2017-10-21 DIAGNOSIS — F988 Other specified behavioral and emotional disorders with onset usually occurring in childhood and adolescence: Secondary | ICD-10-CM | POA: Diagnosis not present

## 2017-10-21 DIAGNOSIS — F329 Major depressive disorder, single episode, unspecified: Secondary | ICD-10-CM | POA: Diagnosis not present

## 2017-10-21 LAB — BASIC METABOLIC PANEL
ANION GAP: 3 — AB (ref 5–15)
BUN: 15 mg/dL (ref 6–20)
CALCIUM: 8.7 mg/dL — AB (ref 8.9–10.3)
CO2: 29 mmol/L (ref 22–32)
Chloride: 106 mmol/L (ref 101–111)
Creatinine, Ser: 0.7 mg/dL (ref 0.44–1.00)
GFR calc Af Amer: >60 mL/min (ref >60)
GLUCOSE: 120 mg/dL — AB (ref 65–99)
Potassium: 4 mmol/L (ref 3.5–5.1)
Sodium: 138 mmol/L (ref 135–145)

## 2017-10-21 LAB — CBC
HEMATOCRIT: 28.7 % — AB (ref 36.0–46.0)
Hemoglobin: 9.5 g/dL — ABNORMAL LOW (ref 12.0–15.0)
MCH: 30.3 pg (ref 26.0–34.0)
MCHC: 33.1 g/dL (ref 30.0–36.0)
MCV: 91.4 fL (ref 78.0–100.0)
PLATELETS: 253 10*3/uL (ref 150–400)
RBC: 3.14 MIL/uL — ABNORMAL LOW (ref 3.87–5.11)
RDW: 14 % (ref 11.5–15.5)
WBC: 9.8 10*3/uL (ref 4.0–10.5)

## 2017-10-21 NOTE — Progress Notes (Signed)
Physical Therapy Treatment Patient Details Name: Sarah James MRN: [id number] DOB: 1951-09-15 Today's Date: 10/21/2017    History of Present Illness pt is 66 yo s/p R TKA  (10/20/2017) and s/p L DA THA Jan 2017    PT Comments    Pt assisted to bathroom and then ambulated short distance in hallway.  Pt educated on safe stair technique.  Pt feels ready for d/c home today.  Follow Up Recommendations  Outpatient PT;DC plan and follow up therapy as arranged by surgeon     Equipment Recommendations  None recommended by PT    Recommendations for Other Services       Precautions / Restrictions Precautions Precautions: Knee;Fall Restrictions Other Position/Activity Restrictions: WBAT    Mobility  Bed Mobility Overal bed mobility: Needs Assistance Bed Mobility: Supine to Sit;Sit to Supine     Supine to sit: Min guard Sit to supine: Min assist   General bed mobility comments: pt self assisted LE with UEs  Transfers Overall transfer level: Needs assistance Equipment used: Rolling walker (2 wheeled) Transfers: Sit to/from Stand Sit to Stand: Min guard         General transfer comment: verbal cues for UE and LE positioning. min/guard for safety  Ambulation/Gait Ambulation/Gait assistance: Min guard Ambulation Distance (Feet): 60 Feet Assistive device: Rolling walker (2 wheeled) Gait Pattern/deviations: Step-through pattern;Decreased stance time - right;Antalgic Gait velocity: decreased   General Gait Details: verbal cues for posture and RW positioning   Stairs Stairs: Yes   Stair Management: One rail Right;Step to pattern;Forwards Number of Stairs: 3 General stair comments: verbal cues for safety and technique, performed semisideways holding R rail with both hands, pt feels comfortable with steps  Wheelchair Mobility    Modified Rankin (Stroke Patients Only)       Balance Overall balance assessment: (had LOB when backing up to commode)                                           Cognition Arousal/Alertness: Awake/alert Behavior During Therapy: WFL for tasks assessed/performed Overall Cognitive Status: Within Functional Limits for tasks assessed                                        Exercises     General Comments        Pertinent Vitals/Pain Pain Assessment: 0-10 Pain Score: 6  Faces Pain Scale: Hurts even more Pain Location: R knee  Pain Descriptors / Indicators: Aching;Sore;Tightness Pain Intervention(s): Limited activity within patient's tolerance;Monitored during session;Repositioned    Home Living Family/patient expects to be discharged to:: Private residence Living Arrangements: Spouse/significant other Available Help at Discharge: Family;Available 24 hours/day         Home Equipment: Walker - 2 wheels;Bedside commode      Prior Function Level of Independence: Independent      Comments: enjoys being active and playing tennis    PT Goals (current goals can now be found in the care plan section) Acute Rehab PT Goals Patient Stated Goal: I want to get back to tennis  Progress towards PT goals: Progressing toward goals    Frequency    7X/week      PT Plan Current plan remains appropriate    Co-evaluation  AM-PAC PT "6 Clicks" Daily Activity  Outcome Measure  Difficulty turning over in bed (including adjusting bedclothes, sheets and blankets)?: A Little Difficulty moving from lying on back to sitting on the side of the bed? : A Little Difficulty sitting down on and standing up from a chair with arms (e.g., wheelchair, bedside commode, etc,.)?: A Little Help needed moving to and from a bed to chair (including a wheelchair)?: A Little Help needed walking in hospital room?: A Little Help needed climbing 3-5 steps with a railing? : A Little 6 Click Score: 18    End of Session Equipment Utilized During Treatment: Gait belt Activity Tolerance: Patient  tolerated treatment well Patient left: in bed;with call bell/phone within reach Nurse Communication: Mobility status PT Visit Diagnosis: Other abnormalities of gait and mobility (R26.89)     Time: 7897-8478 PT Time Calculation (min) (ACUTE ONLY): 21 min  Charges:  $Gait Training: 8-22 mins                    G Codes:       [name], PT, DPT 10/21/2017 Pager: [phone]  York Ram E 10/21/2017, 2:04 PM

## 2017-10-21 NOTE — Progress Notes (Signed)
     Subjective: 1 Day Post-Op Procedure(s) (LRB): RIGHT TOTAL KNEE ARTHROPLASTY (Right)   Patient reports pain as mild/moderate.  No events throughout the night.  Feels that she is doing well, though more pain than after her hip surgery.  Looking forward to progressing with PT.  Ready to be discharged home if she does well with PT and pain stays controlled.   Objective:   VITALS:   Vitals:   10/21/17 0100 10/21/17 0513  BP: (!) 89/57 (!) 91/49  Pulse: 70 64  Resp: 16 16  Temp: 98.5 F (36.9 C) 98.4 F (36.9 C)  SpO2: 100% 99%    Dorsiflexion/Plantar flexion intact Incision: dressing C/D/I No cellulitis present Compartment soft  LABS Recent Labs    10/21/17 0611  HGB 9.5*  HCT 28.7*  WBC 9.8  PLT 253    Recent Labs    10/21/17 0611  NA 138  K 4.0  BUN 15  CREATININE 0.70  GLUCOSE 120*     Assessment/Plan: 1 Day Post-Op Procedure(s) (LRB): RIGHT TOTAL KNEE ARTHROPLASTY (Right) Foley cath d/c'ed Advance diet Up with therapy D/C IV fluids Discharge home Follow up in 2 weeks at [facility]. Follow up with OLIN,Jadie Comas D in 2 weeks.  Contact information:  [facility] [address], [place] [phone]    Obese (BMI 30-39.9) Estimated body mass index is 30.95 kg/m as calculated from the following:   Height as of this encounter: 5\' 5"  (1.651 m).   Weight as of this encounter: 84.4 kg (186 lb). Patient also counseled that weight may inhibit the healing process Patient counseled that losing weight will help with future health issues         West Pugh. Rebbeca Sheperd   PAC  10/21/2017, 9:53 AM

## 2017-10-21 NOTE — Progress Notes (Signed)
Discharge planning, no HH needs identified. Plan for OP PT, has DME. [phone] 

## 2017-10-21 NOTE — Evaluation (Signed)
Occupational Therapy Evaluation Patient Details Name: Sarah James MRN: [id number] DOB: [birth year] Today's Date: 10/21/2017    History of Present Illness pt is 66 yo s/p RTKA  (10/20/2017) and s/p LDATHA Jan 2017   Clinical Impression   Pt was admitted for the above sx. All education was completed. No further OT is needed at this time    Follow Up Recommendations  Supervision/Assistance - 24 hour    Equipment Recommendations  None recommended by OT    Recommendations for Other Services       Precautions / Restrictions Precautions Precautions: Knee;Fall Restrictions Other Position/Activity Restrictions: WBAT      Mobility Bed Mobility Overal bed mobility: Needs Assistance Bed Mobility: Supine to Sit;Sit to Supine     Supine to sit: Min guard Sit to supine: Min assist   General bed mobility comments: assst for RLE for back to bed due to increased pain  Transfers Overall transfer level: Needs assistance Equipment used: Rolling walker (2 wheeled) Transfers: Sit to/from Stand Sit to Stand: Min guard         General transfer comment: cues for UE/LE placement.  For safety.    Balance Overall balance assessment: (had LOB when backing up to commode)                                         ADL either performed or assessed with clinical judgement   ADL Overall ADL's : Needs assistance/impaired Eating/Feeding: Independent   Grooming: Wash/dry hands;Min guard;Standing   Upper Body Bathing: Set up;Sitting   Lower Body Bathing: Minimal assistance;Sit to/from stand   Upper Body Dressing : Set up;Sitting   Lower Body Dressing: Minimal assistance;Sit to/from stand   Toilet Transfer: Minimal assistance;Ambulation;BSC;RW   Toileting- Water quality scientist and Hygiene: Min guard;Sit to/from stand         General ADL Comments: pt had LOB when getting ready to sit on commode: required assistance from OT to correct.  Cues for UE/LE placement.   Reviewed shower sequence with pt:  she performed with last sx and did not feel she needed to practice.  Educated that she should use 3;1 commode as a seat initially and that husband will need to wipe legs dry to avoid rusting     Vision         Perception     Praxis      Pertinent Vitals/Pain Pain Assessment: Faces Pain Score: 3  Faces Pain Scale: Hurts even more Pain Location: R knee  Pain Descriptors / Indicators: Aching;Sore;Tightness Pain Intervention(s): Limited activity within patient's tolerance;Monitored during session;Repositioned;Ice applied     Hand Dominance     Extremity/Trunk Assessment Upper Extremity Assessment Upper Extremity Assessment: Overall WFL for tasks assessed           Communication Communication Communication: No difficulties   Cognition Arousal/Alertness: Awake/alert Behavior During Therapy: WFL for tasks assessed/performed Overall Cognitive Status: Within Functional Limits for tasks assessed                                     General Comments       Exercises    Shoulder Instructions      Home Living Family/patient expects to be discharged to:: Private residence Living Arrangements: Spouse/significant other Available Help at Discharge: Family;Available 24 hours/day  Bathroom Shower/Tub: Occupational psychologist: Standard     Home Equipment: Environmental consultant - 2 wheels;Bedside commode          Prior Functioning/Environment Level of Independence: Independent        Comments: enjoys being active and playing tennis         OT Problem List:        OT Treatment/Interventions:      OT Goals(Current goals can be found in the care plan section) Acute Rehab OT Goals Patient Stated Goal: I want to get back to tennis  OT Goal Formulation: All assessment and education complete, DC therapy  OT Frequency:     Barriers to D/C:            Co-evaluation              AM-PAC PT "6  Clicks" Daily Activity     Outcome Measure Help from another person eating meals?: None Help from another person taking care of personal grooming?: A Little Help from another person toileting, which includes using toliet, bedpan, or urinal?: A Little Help from another person bathing (including washing, rinsing, drying)?: A Little Help from another person to put on and taking off regular upper body clothing?: A Little Help from another person to put on and taking off regular lower body clothing?: A Little 6 Click Score: 19   End of Session    Activity Tolerance: Patient limited by pain Patient left: in bed;with call bell/phone within reach;with family/visitor present  OT Visit Diagnosis: Pain Pain - Right/Left: Right Pain - part of body: Leg                Time: 1030-1052 OT Time Calculation (min): 22 min Charges:  OT General Charges $OT Visit: 1 Visit OT Evaluation $OT Eval Low Complexity: 1 Low G-Codes: OT G-codes **NOT FOR INPATIENT CLASS** Functional Assessment Tool Used: Clinical judgement Functional Limitation: Self care Self Care Current Status (U1324): At least 20 percent but less than 40 percent impaired, limited or restricted Self Care Goal Status (M0102): At least 20 percent but less than 40 percent impaired, limited or restricted Self Care Discharge Status [phone]): At least 20 percent but less than 40 percent impaired, limited or restricted   Lesle Chris, OTR/L [phone] 10/21/2017  Alika Eppes 10/21/2017, 1:27 PM

## 2017-10-21 NOTE — Progress Notes (Signed)
Physical Therapy Treatment Patient Details Name: Sarah James MRN: [id number] DOB: Aug 07, 1951 Today's Date: 10/21/2017    History of Present Illness pt is 66 yo s/p RTKA today (10/20/2017) and s/p LDATHA Jan 2017    PT Comments    Pt ambulated in hallway and performed LE exercises upon return to supine.  Will return for afternoon session to practice steps due to possible d/c home later today.  Follow Up Recommendations  Outpatient PT;DC plan and follow up therapy as arranged by surgeon     Equipment Recommendations  None recommended by PT    Recommendations for Other Services       Precautions / Restrictions Precautions Precautions: Knee;Fall Restrictions Other Position/Activity Restrictions: WBAT    Mobility  Bed Mobility Overal bed mobility: Needs Assistance Bed Mobility: Supine to Sit;Sit to Supine     Supine to sit: Min guard Sit to supine: Min guard   General bed mobility comments: pt self assisted LE with UEs  Transfers Overall transfer level: Needs assistance Equipment used: Rolling walker (2 wheeled) Transfers: Sit to/from Stand Sit to Stand: Min guard         General transfer comment: verbal cues for UE and LE positioning. min/guard for safety (low BP this morning however no dizziness during session today)  Ambulation/Gait Ambulation/Gait assistance: Min guard Ambulation Distance (Feet): 120 Feet Assistive device: Rolling walker (2 wheeled) Gait Pattern/deviations: Step-through pattern;Decreased stance time - right;Antalgic     General Gait Details: verbal cues for step length and RW positioning   Stairs            Wheelchair Mobility    Modified Rankin (Stroke Patients Only)       Balance                                            Cognition Arousal/Alertness: Awake/alert Behavior During Therapy: WFL for tasks assessed/performed Overall Cognitive Status: Within Functional Limits for tasks assessed                                         Exercises Total Joint Exercises Ankle Circles/Pumps: AROM;10 reps;Supine;Both Quad Sets: AROM;10 reps;Right;Supine Short Arc Quad: AAROM;10 reps;Right Heel Slides: AAROM;Right;10 reps;Supine Hip ABduction/ADduction: AROM;Right;10 reps;Supine Straight Leg Raises: AROM;Right;10 reps    General Comments        Pertinent Vitals/Pain Pain Assessment: 0-10 Pain Score: 3  Pain Location: R knee  Pain Descriptors / Indicators: Aching;Sore;Tightness Pain Intervention(s): Monitored during session;Repositioned;Limited activity within patient's tolerance;Ice applied    Home Living                      Prior Function            PT Goals (current goals can now be found in the care plan section) Progress towards PT goals: Progressing toward goals    Frequency    7X/week      PT Plan Current plan remains appropriate    Co-evaluation              AM-PAC PT "6 Clicks" Daily Activity  Outcome Measure  Difficulty turning over in bed (including adjusting bedclothes, sheets and blankets)?: A Little Difficulty moving from lying on back to sitting on the side of the bed? : A Little Difficulty  sitting down on and standing up from a chair with arms (e.g., wheelchair, bedside commode, etc,.)?: A Little Help needed moving to and from a bed to chair (including a wheelchair)?: A Little Help needed walking in hospital room?: A Little Help needed climbing 3-5 steps with a railing? : A Little 6 Click Score: 18    End of Session Equipment Utilized During Treatment: Gait belt Activity Tolerance: Patient tolerated treatment well Patient left: in bed;with call bell/phone within reach   PT Visit Diagnosis: Other abnormalities of gait and mobility (R26.89)     Time: 2440-1027 PT Time Calculation (min) (ACUTE ONLY): 20 min  Charges:  $Therapeutic Exercise: 8-22 mins                    G Codes:       [name], PT,  DPT 10/21/2017 Pager: [phone]  York Ram E 10/21/2017, 12:52 PM

## 2017-10-21 NOTE — Care Management Obs Status (Signed)
MEDICARE OBSERVATION STATUS NOTIFICATION   Patient Details  Name: Sarah James MRN: [id number] Date of Birth: Dec 25, 1950   Medicare Observation Status Notification Given:  Yes    Guadalupe Maple, RN 10/21/2017, 11:05 AM

## 2017-10-26 DIAGNOSIS — M25561 Pain in right knee: Secondary | ICD-10-CM | POA: Diagnosis not present

## 2017-10-26 NOTE — Discharge Summary (Signed)
Physician Discharge Summary  Patient ID: Sarah James MRN: [id number] DOB/AGE: 1951-02-13 66 y.o.  Admit date: 10/20/2017 Discharge date: 10/21/2017   Procedures:  Procedure(s) (LRB): RIGHT TOTAL KNEE ARTHROPLASTY (Right)  Attending Physician:  Dr. Paralee Cancel   Admission Diagnoses:   Right knee primary OA / pain  Discharge Diagnoses:  Principal Problem:   S/P right TKA Active Problems:   Obese  Past Medical History:  Diagnosis Date  . ADD (attention deficit disorder)   . Anemia    hx of   . Arthritis   . Depression   . GERD (gastroesophageal reflux disease)   . Heartburn   . Hypothyroidism     HPI:    Sarah James, 66 y.o. female, has a history of pain and functional disability in the right knee due to arthritis and has failed non-surgical conservative treatments for greater than 12 weeks to include NSAID's and/or analgesics, corticosteriod injections, viscosupplementation injections and activity modification.  Onset of symptoms was gradual, starting 2+ years ago with gradually worsening course since that time. The patient noted prior procedures on the knee to include  arthroscopy on the right knee(s).  Patient currently rates pain in the right knee(s) at 8 out of 10 with activity. Patient has worsening of pain with activity and weight bearing, pain that interferes with activities of daily living, pain with passive range of motion, crepitus and joint swelling.  Patient has evidence of periarticular osteophytes and joint space narrowing by imaging studies.  There is no active infection.   Risks, benefits and expectations were discussed with the patient.  Risks including but not limited to the risk of anesthesia, blood clots, nerve damage, blood vessel damage, failure of the prosthesis, infection and up to and including death.  Patient understand the risks, benefits and expectations and wishes to proceed with surgery.   PCP: Lujean Amel, MD   Discharged Condition:  good  Hospital Course:  Patient underwent the above stated procedure on 10/20/2017. Patient tolerated the procedure well and brought to the recovery room in good condition and subsequently to the floor.  POD #1 BP: 91/49 ; Pulse: 64 ; Temp: 98.4 F (36.9 C) ; Resp: 16 Patient reports pain as mild/moderate.  No events throughout the night.  Feels that she is doing well, though more pain than after her hip surgery.  Looking forward to progressing with PT.  Ready to be discharged home. Dorsiflexion/plantar flexion intact, incision: dressing C/D/I, no cellulitis present and compartment soft.   LABS  Basename    HGB     9.5  HCT     28.7    Discharge Exam: General appearance: alert, cooperative and no distress Extremities: Homans sign is negative, no sign of DVT, no edema, redness or tenderness in the calves or thighs and no ulcers, gangrene or trophic changes  Disposition: Home with follow up in 2 weeks   Follow-up Information    Paralee Cancel, MD. Schedule an appointment as soon as possible for a visit in 2 week(s).   Specialty:  Orthopedic Surgery Contact information: [address] [place] [address] [phone]           Discharge Instructions    Call MD / Call 911   Complete by:  As directed    If you experience chest pain or shortness of breath, CALL 911 and be transported to the hospital emergency room.  If you develope a fever above 101 F, pus (white drainage) or increased drainage or redness  at the wound, or calf pain, call your surgeon's office.   Change dressing   Complete by:  As directed    Maintain surgical dressing until follow up in the clinic. If the edges start to pull up, may reinforce with tape. If the dressing is no longer working, may remove and cover with gauze and tape, but must keep the area dry and clean.  Call with any questions or concerns.   Constipation Prevention   Complete by:  As directed    Drink plenty of fluids.  Prune  juice may be helpful.  You may use a stool softener, such as Colace (over the counter) 100 mg twice a day.  Use MiraLax (over the counter) for constipation as needed.   Diet - low sodium heart healthy   Complete by:  As directed    Discharge instructions   Complete by:  As directed    Maintain surgical dressing until follow up in the clinic. If the edges start to pull up, may reinforce with tape. If the dressing is no longer working, may remove and cover with gauze and tape, but must keep the area dry and clean.  Follow up in 2 weeks at [facility]. Call with any questions or concerns.   Increase activity slowly as tolerated   Complete by:  As directed    Weight bearing as tolerated with assist device (walker, cane, etc) as directed, use it as long as suggested by your surgeon or therapist, typically at least 4-6 weeks.   TED hose   Complete by:  As directed    Use stockings (TED hose) for 2 weeks on both leg(s).  You may remove them at night for sleeping.      Allergies as of 10/21/2017   No Known Allergies     Medication List    STOP taking these medications   acetaminophen 500 MG tablet Commonly known as:  TYLENOL     TAKE these medications   amphetamine-dextroamphetamine 15 MG tablet Commonly known as:  ADDERALL Take 15 mg by mouth daily. What changed:  Another medication with the same name was changed. Make sure you understand how and when to take each.   amphetamine-dextroamphetamine 25 MG 24 hr capsule Commonly known as:  ADDERALL XR Take 1 capsule by mouth every morning. Fill date 07/31/17 What changed:  additional instructions   aspirin 81 MG chewable tablet Commonly known as:  ASPIRIN CHILDRENS Chew 1 tablet (81 mg total) 2 (two) times daily by mouth. Take for 4 weeks.   buPROPion 150 MG 24 hr tablet Commonly known as:  WELLBUTRIN XL Take 1 tablet (150 mg total) by mouth daily.   CENTRUM WOMEN Tabs Take 1 tablet by mouth daily.   docusate sodium  100 MG capsule Commonly known as:  COLACE Take 1 capsule (100 mg total) 2 (two) times daily by mouth.   ferrous sulfate 325 (65 FE) MG tablet Commonly known as:  FERROUSUL Take 1 tablet (325 mg total) 3 (three) times daily with meals by mouth. What changed:    medication strength  how much to take  when to take this   FISH OIL PO Take 1 capsule by mouth daily.   FLUoxetine 10 MG capsule Commonly known as:  PROZAC Take 10 mg by mouth daily.   HYDROcodone-acetaminophen 7.5-325 MG tablet Commonly known as:  NORCO Take 1-2 tablets every 4 (four) hours as needed by mouth for moderate pain or severe pain.   levothyroxine 88 MCG tablet  Commonly known as:  SYNTHROID, LEVOTHROID Take 88 mcg by mouth daily before breakfast.   LUBRICATING EYE DROPS OP Apply 1 drop to eye at bedtime as needed (dry eyes).   MAGNESIUM PO Take 1 tablet by mouth daily.   methocarbamol 500 MG tablet Commonly known as:  ROBAXIN Take 1 tablet (500 mg total) every 6 (six) hours as needed by mouth for muscle spasms.   MULTI-VITAMIN DAILY Tabs Take 1 tablet daily by mouth. Juice plus vitamins What changed:  how much to take   omeprazole 20 MG tablet Commonly known as:  PRILOSEC OTC Take 20 mg by mouth daily as needed (acid reflux).   polyethylene glycol packet Commonly known as:  MIRALAX / GLYCOLAX Take 17 g 2 (two) times daily by mouth.   SLEEP AID PO Take 1 tablet by mouth daily as needed (sleep).   VITAMIN D PO Take 1 capsule by mouth daily.            Discharge Care Instructions  (From admission, onward)        Start     Ordered   10/21/17 0000  Change dressing    Comments:  Maintain surgical dressing until follow up in the clinic. If the edges start to pull up, may reinforce with tape. If the dressing is no longer working, may remove and cover with gauze and tape, but must keep the area dry and clean.  Call with any questions or concerns.   10/21/17 [id number]        Signed: West Pugh. Josephyne Tarter   PA-C  10/26/2017, 11:31 AM

## 2017-10-27 ENCOUNTER — Ambulatory Visit: Payer: Medicare Other | Admitting: Family Medicine

## 2017-10-28 DIAGNOSIS — M25561 Pain in right knee: Secondary | ICD-10-CM | POA: Diagnosis not present

## 2017-11-02 DIAGNOSIS — M25561 Pain in right knee: Secondary | ICD-10-CM | POA: Diagnosis not present

## 2017-11-04 DIAGNOSIS — Z471 Aftercare following joint replacement surgery: Secondary | ICD-10-CM | POA: Diagnosis not present

## 2017-11-04 DIAGNOSIS — M25561 Pain in right knee: Secondary | ICD-10-CM | POA: Diagnosis not present

## 2017-11-04 DIAGNOSIS — Z96651 Presence of right artificial knee joint: Secondary | ICD-10-CM | POA: Diagnosis not present

## 2017-11-06 DIAGNOSIS — M25561 Pain in right knee: Secondary | ICD-10-CM | POA: Diagnosis not present

## 2017-11-09 DIAGNOSIS — M25561 Pain in right knee: Secondary | ICD-10-CM | POA: Diagnosis not present

## 2017-11-11 DIAGNOSIS — M25561 Pain in right knee: Secondary | ICD-10-CM | POA: Diagnosis not present

## 2017-11-13 DIAGNOSIS — M25561 Pain in right knee: Secondary | ICD-10-CM | POA: Diagnosis not present

## 2017-11-18 DIAGNOSIS — M25561 Pain in right knee: Secondary | ICD-10-CM | POA: Diagnosis not present

## 2017-11-20 DIAGNOSIS — M7981 Nontraumatic hematoma of soft tissue: Secondary | ICD-10-CM | POA: Diagnosis not present

## 2017-11-20 DIAGNOSIS — I8312 Varicose veins of left lower extremity with inflammation: Secondary | ICD-10-CM | POA: Diagnosis not present

## 2017-11-20 DIAGNOSIS — M25561 Pain in right knee: Secondary | ICD-10-CM | POA: Diagnosis not present

## 2017-11-23 DIAGNOSIS — M25561 Pain in right knee: Secondary | ICD-10-CM | POA: Diagnosis not present

## 2017-11-25 DIAGNOSIS — M25561 Pain in right knee: Secondary | ICD-10-CM | POA: Diagnosis not present

## 2017-11-27 DIAGNOSIS — M25561 Pain in right knee: Secondary | ICD-10-CM | POA: Diagnosis not present

## 2017-12-11 DIAGNOSIS — M25561 Pain in right knee: Secondary | ICD-10-CM | POA: Insufficient documentation

## 2017-12-11 DIAGNOSIS — G4700 Insomnia, unspecified: Secondary | ICD-10-CM | POA: Diagnosis not present

## 2017-12-11 DIAGNOSIS — Z79899 Other long term (current) drug therapy: Secondary | ICD-10-CM | POA: Diagnosis not present

## 2017-12-11 DIAGNOSIS — F900 Attention-deficit hyperactivity disorder, predominantly inattentive type: Secondary | ICD-10-CM | POA: Diagnosis not present

## 2017-12-11 DIAGNOSIS — E039 Hypothyroidism, unspecified: Secondary | ICD-10-CM | POA: Diagnosis not present

## 2017-12-15 DIAGNOSIS — M25561 Pain in right knee: Secondary | ICD-10-CM | POA: Diagnosis not present

## 2017-12-16 DIAGNOSIS — Z96651 Presence of right artificial knee joint: Secondary | ICD-10-CM | POA: Diagnosis not present

## 2017-12-16 DIAGNOSIS — Z471 Aftercare following joint replacement surgery: Secondary | ICD-10-CM | POA: Diagnosis not present

## 2017-12-22 DIAGNOSIS — M25561 Pain in right knee: Secondary | ICD-10-CM | POA: Diagnosis not present

## 2017-12-30 DIAGNOSIS — M25561 Pain in right knee: Secondary | ICD-10-CM | POA: Diagnosis not present

## 2018-04-12 DIAGNOSIS — J069 Acute upper respiratory infection, unspecified: Secondary | ICD-10-CM | POA: Diagnosis not present

## 2018-04-12 DIAGNOSIS — J209 Acute bronchitis, unspecified: Secondary | ICD-10-CM | POA: Diagnosis not present

## 2018-05-10 DIAGNOSIS — Z96642 Presence of left artificial hip joint: Secondary | ICD-10-CM | POA: Diagnosis not present

## 2018-05-10 DIAGNOSIS — M1612 Unilateral primary osteoarthritis, left hip: Secondary | ICD-10-CM | POA: Diagnosis not present

## 2018-05-10 DIAGNOSIS — Z471 Aftercare following joint replacement surgery: Secondary | ICD-10-CM | POA: Diagnosis not present

## 2018-05-10 DIAGNOSIS — M25562 Pain in left knee: Secondary | ICD-10-CM | POA: Diagnosis not present

## 2018-05-10 DIAGNOSIS — M170 Bilateral primary osteoarthritis of knee: Secondary | ICD-10-CM | POA: Diagnosis not present

## 2018-05-10 DIAGNOSIS — Z96651 Presence of right artificial knee joint: Secondary | ICD-10-CM | POA: Diagnosis not present

## 2018-06-23 DIAGNOSIS — F321 Major depressive disorder, single episode, moderate: Secondary | ICD-10-CM | POA: Diagnosis not present

## 2018-06-23 DIAGNOSIS — M170 Bilateral primary osteoarthritis of knee: Secondary | ICD-10-CM | POA: Diagnosis not present

## 2018-06-23 DIAGNOSIS — Z23 Encounter for immunization: Secondary | ICD-10-CM | POA: Diagnosis not present

## 2018-06-23 DIAGNOSIS — F900 Attention-deficit hyperactivity disorder, predominantly inattentive type: Secondary | ICD-10-CM | POA: Diagnosis not present

## 2018-06-23 DIAGNOSIS — Z1159 Encounter for screening for other viral diseases: Secondary | ICD-10-CM | POA: Diagnosis not present

## 2018-06-23 DIAGNOSIS — Z79899 Other long term (current) drug therapy: Secondary | ICD-10-CM | POA: Diagnosis not present

## 2018-06-23 DIAGNOSIS — L659 Nonscarring hair loss, unspecified: Secondary | ICD-10-CM | POA: Diagnosis not present

## 2018-06-23 DIAGNOSIS — Z136 Encounter for screening for cardiovascular disorders: Secondary | ICD-10-CM | POA: Diagnosis not present

## 2018-06-23 DIAGNOSIS — G4700 Insomnia, unspecified: Secondary | ICD-10-CM | POA: Diagnosis not present

## 2018-06-23 DIAGNOSIS — Z0000 Encounter for general adult medical examination without abnormal findings: Secondary | ICD-10-CM | POA: Diagnosis not present

## 2018-06-23 DIAGNOSIS — E039 Hypothyroidism, unspecified: Secondary | ICD-10-CM | POA: Diagnosis not present

## 2018-07-22 ENCOUNTER — Ambulatory Visit (HOSPITAL_COMMUNITY)
Admission: EM | Admit: 2018-07-22 | Discharge: 2018-07-22 | Disposition: A | Discharge status: Home / Self Care | Payer: PPO | Attending: Family Medicine | Admitting: Family Medicine

## 2018-07-22 ENCOUNTER — Other Ambulatory Visit: Payer: Self-pay | Admitting: Family Medicine

## 2018-07-22 ENCOUNTER — Encounter (HOSPITAL_COMMUNITY): Payer: Self-pay | Admitting: Emergency Medicine

## 2018-07-22 ENCOUNTER — Other Ambulatory Visit: Payer: Self-pay

## 2018-07-22 DIAGNOSIS — N898 Other specified noninflammatory disorders of vagina: Secondary | ICD-10-CM

## 2018-07-22 DIAGNOSIS — G4700 Insomnia, unspecified: Secondary | ICD-10-CM | POA: Diagnosis not present

## 2018-07-22 DIAGNOSIS — Z87891 Personal history of nicotine dependence: Secondary | ICD-10-CM | POA: Diagnosis not present

## 2018-07-22 DIAGNOSIS — E039 Hypothyroidism, unspecified: Secondary | ICD-10-CM | POA: Insufficient documentation

## 2018-07-22 DIAGNOSIS — M1990 Unspecified osteoarthritis, unspecified site: Secondary | ICD-10-CM | POA: Insufficient documentation

## 2018-07-22 DIAGNOSIS — R358 Other polyuria: Secondary | ICD-10-CM | POA: Diagnosis not present

## 2018-07-22 DIAGNOSIS — Z1231 Encounter for screening mammogram for malignant neoplasm of breast: Secondary | ICD-10-CM

## 2018-07-22 DIAGNOSIS — B379 Candidiasis, unspecified: Secondary | ICD-10-CM | POA: Diagnosis not present

## 2018-07-22 DIAGNOSIS — Z96651 Presence of right artificial knee joint: Secondary | ICD-10-CM | POA: Insufficient documentation

## 2018-07-22 DIAGNOSIS — L298 Other pruritus: Secondary | ICD-10-CM | POA: Diagnosis not present

## 2018-07-22 DIAGNOSIS — N390 Urinary tract infection, site not specified: Secondary | ICD-10-CM | POA: Diagnosis not present

## 2018-07-22 DIAGNOSIS — F988 Other specified behavioral and emotional disorders with onset usually occurring in childhood and adolescence: Secondary | ICD-10-CM | POA: Diagnosis not present

## 2018-07-22 DIAGNOSIS — R350 Frequency of micturition: Secondary | ICD-10-CM | POA: Diagnosis present

## 2018-07-22 DIAGNOSIS — K219 Gastro-esophageal reflux disease without esophagitis: Secondary | ICD-10-CM | POA: Diagnosis not present

## 2018-07-22 DIAGNOSIS — Z79899 Other long term (current) drug therapy: Secondary | ICD-10-CM | POA: Diagnosis not present

## 2018-07-22 DIAGNOSIS — R300 Dysuria: Secondary | ICD-10-CM | POA: Diagnosis present

## 2018-07-22 DIAGNOSIS — E669 Obesity, unspecified: Secondary | ICD-10-CM | POA: Insufficient documentation

## 2018-07-22 DIAGNOSIS — R3589 Other polyuria: Secondary | ICD-10-CM

## 2018-07-22 LAB — POCT URINALYSIS DIP (DEVICE)
Bilirubin Urine: NEGATIVE
Glucose, UA: NEGATIVE mg/dL
HGB URINE DIPSTICK: NEGATIVE
Ketones, ur: NEGATIVE mg/dL
Leukocytes, UA: NEGATIVE
NITRITE: NEGATIVE
Protein, ur: NEGATIVE mg/dL
Specific Gravity, Urine: 1.015 (ref 1.005–1.030)
UROBILINOGEN UA: 1 mg/dL (ref 0.0–1.0)
pH: 7.5 (ref 5.0–8.0)

## 2018-07-22 MED ORDER — FLUCONAZOLE 150 MG PO TABS: 150.0000 mg | ORAL_TABLET | Freq: Once | ORAL | 0 refills | Status: AC

## 2018-07-22 MED ORDER — CEPHALEXIN 500 MG PO CAPS: 500.0000 mg | ORAL_CAPSULE | Freq: Three times a day (TID) | ORAL | 0 refills | Status: DC

## 2018-07-22 NOTE — ED Triage Notes (Signed)
Onset of symptoms last Friday 07/16/18.  Symptoms include frequent urination, burning with urination and itchy.

## 2018-07-22 NOTE — Discharge Instructions (Signed)
Although your urine is normal, your symptoms are very consistent with a urinary tract infection.  We are running a urine culture.  We are also starting you on an antibiotic for urinary tract infection while you take the medicine for yeast infection (yeast infections cause itching).

## 2018-07-22 NOTE — ED Provider Notes (Signed)
[place]    CSN: [id number] Arrival date & time: 07/22/18  1742     History   Chief Complaint Chief Complaint  Patient presents with  . Urinary Tract Infection    HPI Sarah James is a 67 y.o. female.   Onset of symptoms last Friday 07/16/18.  Symptoms include frequent urination, burning with urination and itchy.       Past Medical History:  Diagnosis Date  . ADD (attention deficit disorder)   . Anemia    hx of   . Arthritis   . Depression   . GERD (gastroesophageal reflux disease)   . Heartburn   . Hypothyroidism     Patient Active Problem List   Diagnosis Date Noted  . Obese 10/21/2017  . S/P right TKA 10/20/2017  . ADD (attention deficit disorder) 07/27/2017  . Insomnia 07/27/2017  . Depression, major, single episode, in partial remission ([place]) 07/27/2017  . Hypothyroidism 07/27/2017  . S/P left THA, AA 09/30/2016    Past Surgical History:  Procedure Laterality Date  . deviated septum repair    . KNEE ARTHROSCOPY Right 2011  . left knee arthroscopy     . TOTAL HIP ARTHROPLASTY Left 09/30/2016   Procedure: LEFT TOTAL HIP ARTHROPLASTY ANTERIOR APPROACH;  Surgeon: Paralee Cancel, MD;  Location: [facility];  Service: Orthopedics;  Laterality: Left;  . TOTAL KNEE ARTHROPLASTY Right 10/20/2017   Procedure: RIGHT TOTAL KNEE ARTHROPLASTY;  Surgeon: Paralee Cancel, MD;  Location: [facility];  Service: Orthopedics;  Laterality: Right;  Marland Kitchen VARICOSE VEIN SURGERY      OB History    Gravida  2   Para  2   Term  2   Preterm      AB      Living  4     SAB      TAB      Ectopic      Multiple      Live Births  2            Home Medications    Prior to Admission medications   Medication Sig Start Date End Date Taking? Authorizing Provider  amphetamine-dextroamphetamine (ADDERALL XR) 25 MG 24 hr capsule Take 1 capsule by mouth every morning. Fill date 07/31/17 Patient taking differently: Take 25 mg by mouth every morning.  07/27/17   [place],  Betty G, MD  amphetamine-dextroamphetamine (ADDERALL) 15 MG tablet Take 15 mg by mouth daily.    [provider]  buPROPion (WELLBUTRIN XL) 150 MG 24 hr tablet Take 1 tablet (150 mg total) by mouth daily. 07/27/17   [place], Betty G, MD  Carboxymethylcellul-Glycerin (LUBRICATING EYE DROPS OP) Apply 1 drop to eye at bedtime as needed (dry eyes).    [provider]  cephALEXin (KEFLEX) 500 MG capsule Take 1 capsule (500 mg total) by mouth 3 (three) times daily. 07/22/18   Robyn Haber, MD  Cholecalciferol (VITAMIN D PO) Take 1 capsule by mouth daily.    [provider]  Doxylamine Succinate, Sleep, (SLEEP AID PO) Take 1 tablet by mouth daily as needed (sleep).    [provider]  ferrous sulfate (FERROUSUL) 325 (65 FE) MG tablet Take 1 tablet (325 mg total) 3 (three) times daily with meals by mouth. 10/20/17   Babish, Rodman Key, PA-C  fluconazole (DIFLUCAN) 150 MG tablet Take 1 tablet (150 mg total) by mouth once for 1 dose. Repeat if needed 07/22/18 07/22/18  Robyn Haber, MD  FLUoxetine (PROZAC) 10 MG capsule Take 10  mg by mouth daily.    [provider]  levothyroxine (SYNTHROID, LEVOTHROID) 88 MCG tablet Take 88 mcg by mouth daily before breakfast.    [provider]  MAGNESIUM PO Take 1 tablet by mouth daily.    [provider]  Multiple Vitamin (MULTI-VITAMIN DAILY) TABS Take 1 tablet daily by mouth. Juice plus vitamins 10/20/17   Danae Orleans, PA-C  Multiple Vitamins-Minerals (CENTRUM WOMEN) TABS Take 1 tablet by mouth daily.    [provider]  Omega-3 Fatty Acids (FISH OIL PO) Take 1 capsule by mouth daily.    [provider]  omeprazole (PRILOSEC OTC) 20 MG tablet Take 20 mg by mouth daily as needed (acid reflux).    [provider]  polyethylene glycol (MIRALAX / GLYCOLAX) packet Take 17 g 2 (two) times daily by mouth. 10/20/17   Danae Orleans, PA-C    Family History Family History  Problem  Relation Age of Onset  . Arthritis Mother   . Hypertension Mother   . Lung cancer Father   . Stroke Maternal Grandmother   . Hypertension Maternal Grandmother   . Heart disease Paternal Grandfather     Social History Social History   Tobacco Use  . Smoking status: Former Smoker    Last attempt to quit: 12/08/1990    Years since quitting: 27.6  . Smokeless tobacco: Never Used  Substance Use Topics  . Alcohol use: Yes    Alcohol/week: 0.0 standard drinks    Comment: occasional   . Drug use: No     Allergies   Patient has no known allergies.   Review of Systems Review of Systems   Physical Exam Triage Vital Signs ED Triage Vitals [07/22/18 1828]  Enc Vitals Group     BP      Pulse      Resp      Temp      Temp src      SpO2      Weight      Height      Head Circumference      Peak Flow      Pain Score 4     Pain Loc      Pain Edu?      Excl. in [place]?    No data found.  Updated Vital Signs BP 103/74 (BP Location: Left Arm)   Pulse 94   Temp 98.5 F (36.9 C) (Oral)   Resp 18   LMP 12/08/2002   SpO2 97%   Visual Acuity Right Eye Distance:   Left Eye Distance:   Bilateral Distance:    Right Eye Near:   Left Eye Near:    Bilateral Near:     Physical Exam  Constitutional: She is oriented to person, place, and time. She appears well-developed and well-nourished.  HENT:  Head: Normocephalic.  Right Ear: External ear normal.  Left Ear: External ear normal.  Eyes: Pupils are equal, round, and reactive to light. Conjunctivae are normal.  Neck: Normal range of motion. Neck supple.  Pulmonary/Chest: Effort normal.  Musculoskeletal: Normal range of motion.  Neurological: She is alert and oriented to person, place, and time.  Skin: Skin is warm and dry.  Nursing note and vitals reviewed.    UC Treatments / Results  Labs (all labs ordered are listed, but only abnormal results are displayed) Labs Reviewed  URINE CULTURE  POCT URINALYSIS DIP  (DEVICE)    EKG None  Radiology No results found.  Procedures Procedures (  including critical care time)  Medications Ordered in UC Medications - No data to display  Initial Impression / Assessment and Plan / UC Course  I have reviewed the triage vital signs and the nursing notes.  Pertinent labs & imaging results that were available during my care of the patient were reviewed by me and considered in my medical decision making (see chart for details).    Final Clinical Impressions(s) / UC Diagnoses   Final diagnoses:  Polyuria  Vaginal itching     Discharge Instructions     Although your urine is normal, your symptoms are very consistent with a urinary tract infection.  We are running a urine culture.  We are also starting you on an antibiotic for urinary tract infection while you take the medicine for yeast infection (yeast infections cause itching).    ED Prescriptions    Medication Sig Dispense Auth. Provider   cephALEXin (KEFLEX) 500 MG capsule Take 1 capsule (500 mg total) by mouth 3 (three) times daily. 20 capsule Robyn Haber, MD   fluconazole (DIFLUCAN) 150 MG tablet Take 1 tablet (150 mg total) by mouth once for 1 dose. Repeat if needed 2 tablet Robyn Haber, MD     Controlled Substance Prescriptions [place] Controlled Substance Registry consulted? Not Applicable   Robyn Haber, MD 07/22/18 [phone]

## 2018-07-24 LAB — URINE CULTURE

## 2018-07-27 DIAGNOSIS — L821 Other seborrheic keratosis: Secondary | ICD-10-CM | POA: Diagnosis not present

## 2018-07-27 DIAGNOSIS — D2271 Melanocytic nevi of right lower limb, including hip: Secondary | ICD-10-CM | POA: Diagnosis not present

## 2018-07-27 DIAGNOSIS — L820 Inflamed seborrheic keratosis: Secondary | ICD-10-CM | POA: Diagnosis not present

## 2018-07-27 DIAGNOSIS — D2371 Other benign neoplasm of skin of right lower limb, including hip: Secondary | ICD-10-CM | POA: Diagnosis not present

## 2018-07-27 DIAGNOSIS — L812 Freckles: Secondary | ICD-10-CM | POA: Diagnosis not present

## 2018-08-12 ENCOUNTER — Other Ambulatory Visit: Payer: Self-pay | Admitting: Family Medicine

## 2018-08-12 DIAGNOSIS — E2839 Other primary ovarian failure: Secondary | ICD-10-CM

## 2018-08-25 ENCOUNTER — Ambulatory Visit
Admission: RE | Admit: 2018-08-25 | Discharge: 2018-08-25 | Disposition: A | Discharge status: Home / Self Care | Payer: PPO | Source: Ambulatory Visit | Attending: Family Medicine | Admitting: Family Medicine

## 2018-08-25 DIAGNOSIS — Z1231 Encounter for screening mammogram for malignant neoplasm of breast: Secondary | ICD-10-CM | POA: Diagnosis not present

## 2018-10-08 ENCOUNTER — Other Ambulatory Visit: Payer: PPO

## 2018-10-13 DIAGNOSIS — Z86010 Personal history of colonic polyps: Secondary | ICD-10-CM | POA: Diagnosis not present

## 2018-10-13 DIAGNOSIS — K625 Hemorrhage of anus and rectum: Secondary | ICD-10-CM | POA: Diagnosis not present

## 2018-10-13 DIAGNOSIS — E039 Hypothyroidism, unspecified: Secondary | ICD-10-CM | POA: Diagnosis not present

## 2018-10-15 DIAGNOSIS — L814 Other melanin hyperpigmentation: Secondary | ICD-10-CM | POA: Diagnosis not present

## 2018-10-15 DIAGNOSIS — L821 Other seborrheic keratosis: Secondary | ICD-10-CM | POA: Diagnosis not present

## 2018-10-18 ENCOUNTER — Ambulatory Visit
Admission: RE | Admit: 2018-10-18 | Discharge: 2018-10-18 | Disposition: A | Discharge status: Home / Self Care | Payer: PPO | Source: Ambulatory Visit | Attending: Family Medicine | Admitting: Family Medicine

## 2018-10-18 DIAGNOSIS — Z13820 Encounter for screening for osteoporosis: Secondary | ICD-10-CM | POA: Diagnosis not present

## 2018-10-18 DIAGNOSIS — Z780 Asymptomatic menopausal state: Secondary | ICD-10-CM | POA: Diagnosis not present

## 2018-10-18 DIAGNOSIS — E2839 Other primary ovarian failure: Secondary | ICD-10-CM

## 2018-12-15 DIAGNOSIS — R399 Unspecified symptoms and signs involving the genitourinary system: Secondary | ICD-10-CM | POA: Diagnosis not present

## 2018-12-27 DIAGNOSIS — F321 Major depressive disorder, single episode, moderate: Secondary | ICD-10-CM | POA: Diagnosis not present

## 2018-12-27 DIAGNOSIS — E039 Hypothyroidism, unspecified: Secondary | ICD-10-CM | POA: Diagnosis not present

## 2018-12-27 DIAGNOSIS — F900 Attention-deficit hyperactivity disorder, predominantly inattentive type: Secondary | ICD-10-CM | POA: Diagnosis not present

## 2018-12-28 DIAGNOSIS — Z86010 Personal history of colonic polyps: Secondary | ICD-10-CM | POA: Diagnosis not present

## 2018-12-28 DIAGNOSIS — K635 Polyp of colon: Secondary | ICD-10-CM | POA: Diagnosis not present

## 2018-12-28 DIAGNOSIS — D124 Benign neoplasm of descending colon: Secondary | ICD-10-CM | POA: Diagnosis not present

## 2018-12-28 DIAGNOSIS — D120 Benign neoplasm of cecum: Secondary | ICD-10-CM | POA: Diagnosis not present

## 2018-12-30 DIAGNOSIS — E039 Hypothyroidism, unspecified: Secondary | ICD-10-CM | POA: Diagnosis not present

## 2019-01-05 ENCOUNTER — Other Ambulatory Visit (HOSPITAL_COMMUNITY)
Admission: RE | Admit: 2019-01-05 | Discharge: 2019-01-05 | Disposition: A | Discharge status: Home / Self Care | Payer: PPO | Source: Ambulatory Visit | Attending: Obstetrics and Gynecology | Admitting: Obstetrics and Gynecology

## 2019-01-05 ENCOUNTER — Other Ambulatory Visit: Payer: Self-pay | Admitting: Obstetrics and Gynecology

## 2019-01-05 DIAGNOSIS — Z124 Encounter for screening for malignant neoplasm of cervix: Secondary | ICD-10-CM | POA: Diagnosis not present

## 2019-01-05 DIAGNOSIS — N8110 Cystocele, unspecified: Secondary | ICD-10-CM | POA: Diagnosis not present

## 2019-01-05 DIAGNOSIS — R3915 Urgency of urination: Secondary | ICD-10-CM | POA: Diagnosis not present

## 2019-01-05 DIAGNOSIS — Z01419 Encounter for gynecological examination (general) (routine) without abnormal findings: Secondary | ICD-10-CM | POA: Insufficient documentation

## 2019-01-05 DIAGNOSIS — N390 Urinary tract infection, site not specified: Secondary | ICD-10-CM | POA: Diagnosis not present

## 2019-01-05 DIAGNOSIS — Z01411 Encounter for gynecological examination (general) (routine) with abnormal findings: Secondary | ICD-10-CM | POA: Diagnosis not present

## 2019-01-07 LAB — CYTOLOGY - PAP
Diagnosis: NEGATIVE
HPV: NOT DETECTED

## 2019-01-21 DIAGNOSIS — M25562 Pain in left knee: Secondary | ICD-10-CM | POA: Diagnosis not present

## 2019-01-21 DIAGNOSIS — Z9660 Presence of unspecified orthopedic joint implant: Secondary | ICD-10-CM | POA: Diagnosis not present

## 2019-01-31 DIAGNOSIS — D509 Iron deficiency anemia, unspecified: Secondary | ICD-10-CM | POA: Diagnosis not present

## 2019-01-31 DIAGNOSIS — R3915 Urgency of urination: Secondary | ICD-10-CM | POA: Diagnosis not present

## 2019-02-16 DIAGNOSIS — R490 Dysphonia: Secondary | ICD-10-CM | POA: Diagnosis not present

## 2019-02-16 DIAGNOSIS — J069 Acute upper respiratory infection, unspecified: Secondary | ICD-10-CM | POA: Diagnosis not present

## 2019-02-16 DIAGNOSIS — J029 Acute pharyngitis, unspecified: Secondary | ICD-10-CM | POA: Diagnosis not present

## 2019-02-16 DIAGNOSIS — R05 Cough: Secondary | ICD-10-CM | POA: Diagnosis not present

## 2019-06-09 DIAGNOSIS — Z96651 Presence of right artificial knee joint: Secondary | ICD-10-CM | POA: Diagnosis not present

## 2019-06-09 DIAGNOSIS — Z471 Aftercare following joint replacement surgery: Secondary | ICD-10-CM | POA: Diagnosis not present

## 2019-06-09 DIAGNOSIS — M25562 Pain in left knee: Secondary | ICD-10-CM | POA: Diagnosis not present

## 2019-07-05 DIAGNOSIS — F321 Major depressive disorder, single episode, moderate: Secondary | ICD-10-CM | POA: Diagnosis not present

## 2019-07-05 DIAGNOSIS — Z79899 Other long term (current) drug therapy: Secondary | ICD-10-CM | POA: Diagnosis not present

## 2019-07-05 DIAGNOSIS — R5383 Other fatigue: Secondary | ICD-10-CM | POA: Diagnosis not present

## 2019-07-05 DIAGNOSIS — F900 Attention-deficit hyperactivity disorder, predominantly inattentive type: Secondary | ICD-10-CM | POA: Diagnosis not present

## 2019-07-05 DIAGNOSIS — D509 Iron deficiency anemia, unspecified: Secondary | ICD-10-CM | POA: Diagnosis not present

## 2019-07-05 DIAGNOSIS — E039 Hypothyroidism, unspecified: Secondary | ICD-10-CM | POA: Diagnosis not present

## 2019-07-05 DIAGNOSIS — Z0001 Encounter for general adult medical examination with abnormal findings: Secondary | ICD-10-CM | POA: Diagnosis not present

## 2019-08-01 ENCOUNTER — Other Ambulatory Visit: Payer: Self-pay | Admitting: Family Medicine

## 2019-08-01 DIAGNOSIS — R6889 Other general symptoms and signs: Secondary | ICD-10-CM

## 2019-08-16 ENCOUNTER — Ambulatory Visit
Admission: RE | Admit: 2019-08-16 | Discharge: 2019-08-16 | Disposition: A | Discharge status: Home / Self Care | Payer: PPO | Source: Ambulatory Visit | Attending: Family Medicine | Admitting: Family Medicine

## 2019-08-16 DIAGNOSIS — D1801 Hemangioma of skin and subcutaneous tissue: Secondary | ICD-10-CM | POA: Diagnosis not present

## 2019-08-16 DIAGNOSIS — L814 Other melanin hyperpigmentation: Secondary | ICD-10-CM | POA: Diagnosis not present

## 2019-08-16 DIAGNOSIS — R6889 Other general symptoms and signs: Secondary | ICD-10-CM

## 2019-08-16 DIAGNOSIS — L821 Other seborrheic keratosis: Secondary | ICD-10-CM | POA: Diagnosis not present

## 2019-08-16 DIAGNOSIS — D2222 Melanocytic nevi of left ear and external auricular canal: Secondary | ICD-10-CM | POA: Diagnosis not present

## 2019-08-16 DIAGNOSIS — Z0001 Encounter for general adult medical examination with abnormal findings: Secondary | ICD-10-CM | POA: Diagnosis not present

## 2019-08-16 DIAGNOSIS — D225 Melanocytic nevi of trunk: Secondary | ICD-10-CM | POA: Diagnosis not present

## 2019-10-31 DIAGNOSIS — Z119 Encounter for screening for infectious and parasitic diseases, unspecified: Secondary | ICD-10-CM | POA: Diagnosis not present

## 2019-12-21 DIAGNOSIS — H2513 Age-related nuclear cataract, bilateral: Secondary | ICD-10-CM | POA: Diagnosis not present

## 2019-12-21 DIAGNOSIS — H5203 Hypermetropia, bilateral: Secondary | ICD-10-CM | POA: Diagnosis not present

## 2019-12-21 DIAGNOSIS — H524 Presbyopia: Secondary | ICD-10-CM | POA: Diagnosis not present

## 2019-12-23 DIAGNOSIS — M1712 Unilateral primary osteoarthritis, left knee: Secondary | ICD-10-CM | POA: Diagnosis not present

## 2019-12-23 DIAGNOSIS — M25562 Pain in left knee: Secondary | ICD-10-CM | POA: Diagnosis not present

## 2020-02-12 DIAGNOSIS — Z1152 Encounter for screening for COVID-19: Secondary | ICD-10-CM | POA: Diagnosis not present

## 2020-03-29 DIAGNOSIS — G4700 Insomnia, unspecified: Secondary | ICD-10-CM | POA: Diagnosis not present

## 2020-03-29 DIAGNOSIS — F900 Attention-deficit hyperactivity disorder, predominantly inattentive type: Secondary | ICD-10-CM | POA: Diagnosis not present

## 2020-03-29 DIAGNOSIS — F321 Major depressive disorder, single episode, moderate: Secondary | ICD-10-CM | POA: Diagnosis not present

## 2020-05-16 ENCOUNTER — Other Ambulatory Visit: Payer: Self-pay | Admitting: Family Medicine

## 2020-05-16 DIAGNOSIS — Z01419 Encounter for gynecological examination (general) (routine) without abnormal findings: Secondary | ICD-10-CM | POA: Diagnosis not present

## 2020-05-16 DIAGNOSIS — N3281 Overactive bladder: Secondary | ICD-10-CM | POA: Diagnosis not present

## 2020-05-16 DIAGNOSIS — Z1231 Encounter for screening mammogram for malignant neoplasm of breast: Secondary | ICD-10-CM

## 2020-05-18 ENCOUNTER — Other Ambulatory Visit: Payer: Self-pay

## 2020-05-18 ENCOUNTER — Ambulatory Visit: Payer: PPO

## 2020-05-18 ENCOUNTER — Ambulatory Visit
Admission: RE | Admit: 2020-05-18 | Discharge: 2020-05-18 | Disposition: A | Discharge status: Home / Self Care | Payer: PPO | Source: Ambulatory Visit | Attending: Family Medicine | Admitting: Family Medicine

## 2020-05-18 DIAGNOSIS — Z1231 Encounter for screening mammogram for malignant neoplasm of breast: Secondary | ICD-10-CM

## 2020-07-04 DIAGNOSIS — J40 Bronchitis, not specified as acute or chronic: Secondary | ICD-10-CM | POA: Diagnosis not present

## 2020-07-04 DIAGNOSIS — J329 Chronic sinusitis, unspecified: Secondary | ICD-10-CM | POA: Diagnosis not present

## 2020-07-04 DIAGNOSIS — Z03818 Encounter for observation for suspected exposure to other biological agents ruled out: Secondary | ICD-10-CM | POA: Diagnosis not present

## 2020-07-04 DIAGNOSIS — R05 Cough: Secondary | ICD-10-CM | POA: Diagnosis not present

## 2020-07-04 DIAGNOSIS — J029 Acute pharyngitis, unspecified: Secondary | ICD-10-CM | POA: Diagnosis not present

## 2020-07-09 DIAGNOSIS — Z0001 Encounter for general adult medical examination with abnormal findings: Secondary | ICD-10-CM | POA: Diagnosis not present

## 2020-07-09 DIAGNOSIS — G4700 Insomnia, unspecified: Secondary | ICD-10-CM | POA: Diagnosis not present

## 2020-07-09 DIAGNOSIS — E039 Hypothyroidism, unspecified: Secondary | ICD-10-CM | POA: Diagnosis not present

## 2020-07-09 DIAGNOSIS — F900 Attention-deficit hyperactivity disorder, predominantly inattentive type: Secondary | ICD-10-CM | POA: Diagnosis not present

## 2020-07-09 DIAGNOSIS — R5383 Other fatigue: Secondary | ICD-10-CM | POA: Diagnosis not present

## 2020-07-09 DIAGNOSIS — Z136 Encounter for screening for cardiovascular disorders: Secondary | ICD-10-CM | POA: Diagnosis not present

## 2020-07-09 DIAGNOSIS — Z79899 Other long term (current) drug therapy: Secondary | ICD-10-CM | POA: Diagnosis not present

## 2020-07-09 DIAGNOSIS — F321 Major depressive disorder, single episode, moderate: Secondary | ICD-10-CM | POA: Diagnosis not present

## 2020-08-15 DIAGNOSIS — L821 Other seborrheic keratosis: Secondary | ICD-10-CM | POA: Diagnosis not present

## 2020-08-15 DIAGNOSIS — D225 Melanocytic nevi of trunk: Secondary | ICD-10-CM | POA: Diagnosis not present

## 2020-08-15 DIAGNOSIS — D1801 Hemangioma of skin and subcutaneous tissue: Secondary | ICD-10-CM | POA: Diagnosis not present

## 2020-08-15 DIAGNOSIS — L814 Other melanin hyperpigmentation: Secondary | ICD-10-CM | POA: Diagnosis not present

## 2020-08-15 DIAGNOSIS — L720 Epidermal cyst: Secondary | ICD-10-CM | POA: Diagnosis not present

## 2020-08-17 ENCOUNTER — Other Ambulatory Visit: Payer: Self-pay | Admitting: Sleep Medicine

## 2020-08-17 ENCOUNTER — Other Ambulatory Visit: Payer: PPO

## 2020-08-17 DIAGNOSIS — I471 Supraventricular tachycardia: Secondary | ICD-10-CM | POA: Diagnosis not present

## 2020-08-20 DIAGNOSIS — F321 Major depressive disorder, single episode, moderate: Secondary | ICD-10-CM | POA: Diagnosis not present

## 2020-08-20 DIAGNOSIS — E039 Hypothyroidism, unspecified: Secondary | ICD-10-CM | POA: Diagnosis not present

## 2020-08-20 DIAGNOSIS — F900 Attention-deficit hyperactivity disorder, predominantly inattentive type: Secondary | ICD-10-CM | POA: Diagnosis not present

## 2020-08-20 LAB — NOVEL CORONAVIRUS, NAA: SARS-CoV-2, NAA: NOT DETECTED

## 2020-10-19 DIAGNOSIS — J209 Acute bronchitis, unspecified: Secondary | ICD-10-CM | POA: Diagnosis not present

## 2020-11-21 DIAGNOSIS — F900 Attention-deficit hyperactivity disorder, predominantly inattentive type: Secondary | ICD-10-CM | POA: Diagnosis not present

## 2020-11-21 DIAGNOSIS — J3489 Other specified disorders of nose and nasal sinuses: Secondary | ICD-10-CM | POA: Diagnosis not present

## 2020-11-21 DIAGNOSIS — F321 Major depressive disorder, single episode, moderate: Secondary | ICD-10-CM | POA: Diagnosis not present

## 2020-12-13 DIAGNOSIS — J0110 Acute frontal sinusitis, unspecified: Secondary | ICD-10-CM | POA: Diagnosis not present

## 2020-12-31 DIAGNOSIS — M1712 Unilateral primary osteoarthritis, left knee: Secondary | ICD-10-CM | POA: Diagnosis not present

## 2020-12-31 DIAGNOSIS — M25562 Pain in left knee: Secondary | ICD-10-CM | POA: Diagnosis not present

## 2021-04-17 DIAGNOSIS — H524 Presbyopia: Secondary | ICD-10-CM | POA: Diagnosis not present

## 2021-04-17 DIAGNOSIS — H2513 Age-related nuclear cataract, bilateral: Secondary | ICD-10-CM | POA: Diagnosis not present

## 2021-04-17 DIAGNOSIS — H18513 Endothelial corneal dystrophy, bilateral: Secondary | ICD-10-CM | POA: Diagnosis not present

## 2021-04-17 DIAGNOSIS — H5203 Hypermetropia, bilateral: Secondary | ICD-10-CM | POA: Diagnosis not present

## 2021-04-30 DIAGNOSIS — F321 Major depressive disorder, single episode, moderate: Secondary | ICD-10-CM | POA: Diagnosis not present

## 2021-04-30 DIAGNOSIS — F900 Attention-deficit hyperactivity disorder, predominantly inattentive type: Secondary | ICD-10-CM | POA: Diagnosis not present

## 2021-04-30 DIAGNOSIS — G4700 Insomnia, unspecified: Secondary | ICD-10-CM | POA: Diagnosis not present

## 2021-04-30 DIAGNOSIS — E039 Hypothyroidism, unspecified: Secondary | ICD-10-CM | POA: Diagnosis not present

## 2021-06-06 ENCOUNTER — Ambulatory Visit: Payer: PPO | Admitting: Podiatry

## 2021-06-06 ENCOUNTER — Other Ambulatory Visit: Payer: Self-pay

## 2021-06-06 ENCOUNTER — Ambulatory Visit (INDEPENDENT_AMBULATORY_CARE_PROVIDER_SITE_OTHER): Payer: PPO

## 2021-06-06 DIAGNOSIS — M2042 Other hammer toe(s) (acquired), left foot: Secondary | ICD-10-CM

## 2021-06-06 DIAGNOSIS — L600 Ingrowing nail: Secondary | ICD-10-CM | POA: Diagnosis not present

## 2021-06-06 DIAGNOSIS — M2041 Other hammer toe(s) (acquired), right foot: Secondary | ICD-10-CM

## 2021-06-06 DIAGNOSIS — D2371 Other benign neoplasm of skin of right lower limb, including hip: Secondary | ICD-10-CM | POA: Diagnosis not present

## 2021-06-06 DIAGNOSIS — D2372 Other benign neoplasm of skin of left lower limb, including hip: Secondary | ICD-10-CM | POA: Diagnosis not present

## 2021-06-06 MED ORDER — NEOMYCIN-POLYMYXIN-HC 1 % OT SOLN: OTIC | 1 refills | Status: DC

## 2021-06-06 NOTE — Patient Instructions (Signed)

## 2021-06-06 NOTE — Progress Notes (Signed)
Subjective:  Patient ID: Sarah James, female    DOB: 1951-11-02,  MRN: [id number] HPI Chief Complaint  Patient presents with   Foot Problem    "My toes cross over"   Callouses    Right foot medial callous Right 2nd interdigital corn   Nail Problem    Left 1st medial possible ingrown nail    70 y.o. female presents with the above complaint.   ROS: Denies fever chills nausea vomiting muscle aches pains calf pain back pain chest pain shortness of breath.  Past Medical History:  Diagnosis Date   ADD (attention deficit disorder)    Anemia    hx of    Arthritis    Depression    GERD (gastroesophageal reflux disease)    Heartburn    Hypothyroidism    Past Surgical History:  Procedure Laterality Date   deviated septum repair     KNEE ARTHROSCOPY Right 2011   left knee arthroscopy      TOTAL HIP ARTHROPLASTY Left 09/30/2016   Procedure: LEFT TOTAL HIP ARTHROPLASTY ANTERIOR APPROACH;  Surgeon: Paralee Cancel, MD;  Location: [facility];  Service: Orthopedics;  Laterality: Left;   TOTAL KNEE ARTHROPLASTY Right 10/20/2017   Procedure: RIGHT TOTAL KNEE ARTHROPLASTY;  Surgeon: Paralee Cancel, MD;  Location: [facility];  Service: Orthopedics;  Laterality: Right;   VARICOSE VEIN SURGERY      Current Outpatient Medications:    NEOMYCIN-POLYMYXIN-HYDROCORTISONE (CORTISPORIN) 1 % SOLN OTIC solution, Apply 1-2 drops to toe BID after soaking, Disp: 10 mL, Rfl: 1   amphetamine-dextroamphetamine (ADDERALL XR) 25 MG 24 hr capsule, Take 1 capsule by mouth every morning. Fill date 07/31/17 (Patient taking differently: Take 25 mg by mouth every morning. ), Disp: 30 capsule, Rfl: 0   amphetamine-dextroamphetamine (ADDERALL) 15 MG tablet, Take 15 mg by mouth daily., Disp: , Rfl:    benzonatate (TESSALON) 200 MG capsule, benzonatate 200 mg capsule  TAKE 1 CAPSULE 3 TIMES A DAY AS NEEDED FOR COUGH, Disp: , Rfl:    bimatoprost (LATISSE) 0.03 % ophthalmic solution, bimatoprost 0.03 % drops with applicator,  eyelash base  APP 1 GTT TO UPPER EYELID MARGIN ONCE A DAY HS, Disp: , Rfl:    buPROPion (WELLBUTRIN XL) 150 MG 24 hr tablet, Take 1 tablet (150 mg total) by mouth daily., Disp: 90 tablet, Rfl: 0   buPROPion (WELLBUTRIN XL) 150 MG 24 hr tablet, bupropion HCl XL 150 mg 24 hr tablet, extended release, Disp: , Rfl:    Carboxymethylcellul-Glycerin (LUBRICATING EYE DROPS OP), Apply 1 drop to eye at bedtime as needed (dry eyes)., Disp: , Rfl:    cephALEXin (KEFLEX) 500 MG capsule, Take 1 capsule (500 mg total) by mouth 3 (three) times daily., Disp: 20 capsule, Rfl: 0   Cholecalciferol (VITAMIN D PO), Take 1 capsule by mouth daily., Disp: , Rfl:    Desvenlafaxine Succinate ER 25 MG TB24, Take 1 tablet by mouth daily., Disp: , Rfl:    Doxylamine Succinate, Sleep, (SLEEP AID PO), Take 1 tablet by mouth daily as needed (sleep)., Disp: , Rfl:    ferrous sulfate (FERROUSUL) 325 (65 FE) MG tablet, Take 1 tablet (325 mg total) 3 (three) times daily with meals by mouth., Disp: , Rfl: 3   FLUoxetine (PROZAC) 10 MG capsule, Take 10 mg by mouth daily., Disp: , Rfl:    FLUoxetine (PROZAC) 20 MG capsule, fluoxetine 20 mg capsule, Disp: , Rfl:    fluticasone (FLONASE) 50 MCG/ACT nasal spray, fluticasone propionate 50 mcg/actuation nasal spray,suspension,  Disp: , Rfl:    hepatitis A virus, PF, vaccine (HAVRIX) 1440 EL U/ML injection, Havrix (PF) 1,440 ELISA unit/mL intramuscular syringe, Disp: , Rfl:    HYDROcodone bit-homatropine (HYDROMET) 5-1.5 MG/5ML syrup, Hydromet 5 mg-1.5 mg/5 mL oral syrup, Disp: , Rfl:    Influenza Vac High-Dose Quad (FLUZONE HIGH-DOSE QUADRIVALENT) 0.7 ML SUSY, Fluzone High-Dose Quad 2020-21 (PF) 240 mcg/0.7 mL IM syringe, Disp: , Rfl:    levothyroxine (SYNTHROID) 88 MCG tablet, levothyroxine 88 mcg tablet, Disp: , Rfl:    levothyroxine (SYNTHROID, LEVOTHROID) 88 MCG tablet, Take 88 mcg by mouth daily before breakfast., Disp: , Rfl:    MAGNESIUM PO, Take 1 tablet by mouth daily., Disp: , Rfl:     Multiple Vitamin (MULTI-VITAMIN DAILY) TABS, Take 1 tablet daily by mouth. Juice plus vitamins, Disp: 30 tablet, Rfl:    Multiple Vitamins-Minerals (CENTRUM WOMEN) TABS, Take 1 tablet by mouth daily., Disp: , Rfl:    Omega-3 Fatty Acids (FISH OIL PO), Take 1 capsule by mouth daily., Disp: , Rfl:    omeprazole (PRILOSEC OTC) 20 MG tablet, Take 20 mg by mouth daily as needed (acid reflux)., Disp: , Rfl:    pneumococcal 23 valent vaccine (PNEUMOVAX 23) 25 MCG/0.5ML injection, Pneumovax-23 25 mcg/0.5 mL injection syringe, Disp: , Rfl:    polyethylene glycol (GAVILYTE-C) 240 g solution, Gavilyte-C 240 gram-22.72 gram-6.72 gram-5.84 gram oral solution, Disp: , Rfl:    polyethylene glycol (MIRALAX / GLYCOLAX) packet, Take 17 g 2 (two) times daily by mouth., Disp: 14 each, Rfl: 0   traZODone (DESYREL) 100 MG tablet, Take 100 mg by mouth at bedtime as needed., Disp: , Rfl:   No Known Allergies Review of Systems Objective:  There were no vitals filed for this visit.  General: Well developed, nourished, in no acute distress, alert and oriented x3   Dermatological: Skin is warm, dry and supple bilateral. Nails x 10 are well maintained; remaining integument appears unremarkable at this time. There are no open sores, no preulcerative lesions, no rash or signs of infection present.  Sharply abraded nail margin.  There is no erythema to some mild edema no cellulitis drainage or odor tenderness on palpation.  Vascular: Dorsalis Pedis artery and Posterior Tibial artery pedal pulses are 2/4 bilateral with immedate capillary fill time. Pedal hair growth present. No varicosities and no lower extremity edema present bilateral.   Neruologic: Grossly intact via light touch bilateral. Vibratory intact via tuning fork bilateral. Protective threshold with Semmes Wienstein monofilament intact to all pedal sites bilateral. Patellar and Achilles deep tendon reflexes 2+ bilateral. No Babinski or clonus noted bilateral.    Musculoskeletal: No gross boney pedal deformities bilateral. No pain, crepitus, or limitation noted with foot and ankle range of motion bilateral. Muscular strength 5/5 in all groups tested bilateral.  Severe hallux valgus deformities and the juxtaposition of the hallux on the second toe of the right foot is resulting in the reactive hyperkeratotic lesion.  She also has hammertoe deformities which are rigid in nature at the PIPJ's second third and fourth toes bilaterally right greater than left.  Gait: Unassisted, Nonantalgic.    Radiographs:  Radiographs taken today demonstrate an osseously mature individual with moderate osteopenia.  Significant digital deformities with hammertoe deformities and crossing of her toes.  Severe hallux valgus deformities mild tailor's bunion deformities.  No acute findings are noted.  Assessment & Plan:   Assessment: Hallux abductovalgus deformity.  Hammertoe deformity.  Benign skin lesion second toe right.  Ingrown toenail tibial border  hallux left.  Plan: Discussed etiology pathology conservative versus surgical therapies.  At this point debrided reactive hyperkeratotic tissue discussed surgery with her in great detail today and performed a chemical matricectomy to the tibial border of the hallux left.  She was provided with both oral written home-going instructions for the care and soaking of the toe as well as a prescription for Cortisporin Otic to be applied twice daily.  I like to follow-up with her in a couple weeks to make sure she is healing well we once again discussed surgery.     Cheyrl Buley T. [place], [place]

## 2021-06-26 DIAGNOSIS — F900 Attention-deficit hyperactivity disorder, predominantly inattentive type: Secondary | ICD-10-CM | POA: Diagnosis not present

## 2021-06-26 DIAGNOSIS — G4700 Insomnia, unspecified: Secondary | ICD-10-CM | POA: Diagnosis not present

## 2021-06-27 ENCOUNTER — Ambulatory Visit: Payer: PPO | Admitting: Podiatry

## 2021-07-16 DIAGNOSIS — I8311 Varicose veins of right lower extremity with inflammation: Secondary | ICD-10-CM | POA: Diagnosis not present

## 2021-07-16 DIAGNOSIS — I8312 Varicose veins of left lower extremity with inflammation: Secondary | ICD-10-CM | POA: Diagnosis not present

## 2021-07-29 DIAGNOSIS — M7061 Trochanteric bursitis, right hip: Secondary | ICD-10-CM | POA: Diagnosis not present

## 2021-07-29 DIAGNOSIS — Z96651 Presence of right artificial knee joint: Secondary | ICD-10-CM | POA: Diagnosis not present

## 2021-07-29 DIAGNOSIS — M1712 Unilateral primary osteoarthritis, left knee: Secondary | ICD-10-CM | POA: Diagnosis not present

## 2021-08-13 DIAGNOSIS — I8311 Varicose veins of right lower extremity with inflammation: Secondary | ICD-10-CM | POA: Diagnosis not present

## 2021-08-13 DIAGNOSIS — I8312 Varicose veins of left lower extremity with inflammation: Secondary | ICD-10-CM | POA: Diagnosis not present

## 2021-08-14 DIAGNOSIS — I8312 Varicose veins of left lower extremity with inflammation: Secondary | ICD-10-CM | POA: Diagnosis not present

## 2021-08-14 DIAGNOSIS — I8311 Varicose veins of right lower extremity with inflammation: Secondary | ICD-10-CM | POA: Diagnosis not present

## 2021-08-21 DIAGNOSIS — L853 Xerosis cutis: Secondary | ICD-10-CM | POA: Diagnosis not present

## 2021-08-21 DIAGNOSIS — L814 Other melanin hyperpigmentation: Secondary | ICD-10-CM | POA: Diagnosis not present

## 2021-08-21 DIAGNOSIS — L720 Epidermal cyst: Secondary | ICD-10-CM | POA: Diagnosis not present

## 2021-08-21 DIAGNOSIS — D692 Other nonthrombocytopenic purpura: Secondary | ICD-10-CM | POA: Diagnosis not present

## 2021-08-21 DIAGNOSIS — D225 Melanocytic nevi of trunk: Secondary | ICD-10-CM | POA: Diagnosis not present

## 2021-08-21 DIAGNOSIS — D1801 Hemangioma of skin and subcutaneous tissue: Secondary | ICD-10-CM | POA: Diagnosis not present

## 2021-08-21 DIAGNOSIS — L821 Other seborrheic keratosis: Secondary | ICD-10-CM | POA: Diagnosis not present

## 2021-09-24 DIAGNOSIS — U071 COVID-19: Secondary | ICD-10-CM | POA: Diagnosis not present

## 2021-09-24 DIAGNOSIS — R059 Cough, unspecified: Secondary | ICD-10-CM | POA: Diagnosis not present

## 2021-09-24 DIAGNOSIS — R5383 Other fatigue: Secondary | ICD-10-CM | POA: Diagnosis not present

## 2021-10-10 ENCOUNTER — Other Ambulatory Visit: Payer: Self-pay | Admitting: Family Medicine

## 2021-10-10 DIAGNOSIS — Z1231 Encounter for screening mammogram for malignant neoplasm of breast: Secondary | ICD-10-CM

## 2021-10-14 ENCOUNTER — Ambulatory Visit (HOSPITAL_BASED_OUTPATIENT_CLINIC_OR_DEPARTMENT_OTHER): Payer: PPO | Admitting: Obstetrics & Gynecology

## 2021-10-16 ENCOUNTER — Other Ambulatory Visit: Payer: Self-pay | Admitting: Family Medicine

## 2021-10-16 DIAGNOSIS — E2839 Other primary ovarian failure: Secondary | ICD-10-CM

## 2021-10-16 DIAGNOSIS — I83892 Varicose veins of left lower extremities with other complications: Secondary | ICD-10-CM | POA: Diagnosis not present

## 2021-10-16 DIAGNOSIS — I87392 Chronic venous hypertension (idiopathic) with other complications of left lower extremity: Secondary | ICD-10-CM | POA: Diagnosis not present

## 2021-10-16 DIAGNOSIS — Z09 Encounter for follow-up examination after completed treatment for conditions other than malignant neoplasm: Secondary | ICD-10-CM | POA: Diagnosis not present

## 2021-10-16 DIAGNOSIS — I87322 Chronic venous hypertension (idiopathic) with inflammation of left lower extremity: Secondary | ICD-10-CM | POA: Diagnosis not present

## 2021-10-23 DIAGNOSIS — Z09 Encounter for follow-up examination after completed treatment for conditions other than malignant neoplasm: Secondary | ICD-10-CM | POA: Diagnosis not present

## 2021-10-23 DIAGNOSIS — I8311 Varicose veins of right lower extremity with inflammation: Secondary | ICD-10-CM | POA: Diagnosis not present

## 2021-10-23 DIAGNOSIS — I87321 Chronic venous hypertension (idiopathic) with inflammation of right lower extremity: Secondary | ICD-10-CM | POA: Diagnosis not present

## 2021-10-23 DIAGNOSIS — I87391 Chronic venous hypertension (idiopathic) with other complications of right lower extremity: Secondary | ICD-10-CM | POA: Diagnosis not present

## 2021-10-25 DIAGNOSIS — M1712 Unilateral primary osteoarthritis, left knee: Secondary | ICD-10-CM | POA: Diagnosis not present

## 2021-10-25 DIAGNOSIS — M25551 Pain in right hip: Secondary | ICD-10-CM | POA: Diagnosis not present

## 2021-10-25 DIAGNOSIS — M7061 Trochanteric bursitis, right hip: Secondary | ICD-10-CM | POA: Diagnosis not present

## 2021-10-25 DIAGNOSIS — M1611 Unilateral primary osteoarthritis, right hip: Secondary | ICD-10-CM | POA: Diagnosis not present

## 2021-10-29 ENCOUNTER — Ambulatory Visit (HOSPITAL_BASED_OUTPATIENT_CLINIC_OR_DEPARTMENT_OTHER): Payer: PPO | Admitting: Obstetrics & Gynecology

## 2021-10-29 DIAGNOSIS — I87322 Chronic venous hypertension (idiopathic) with inflammation of left lower extremity: Secondary | ICD-10-CM | POA: Diagnosis not present

## 2021-10-29 DIAGNOSIS — I83892 Varicose veins of left lower extremities with other complications: Secondary | ICD-10-CM | POA: Diagnosis not present

## 2021-10-29 DIAGNOSIS — I87392 Chronic venous hypertension (idiopathic) with other complications of left lower extremity: Secondary | ICD-10-CM | POA: Diagnosis not present

## 2021-10-29 DIAGNOSIS — Z09 Encounter for follow-up examination after completed treatment for conditions other than malignant neoplasm: Secondary | ICD-10-CM | POA: Diagnosis not present

## 2021-11-05 DIAGNOSIS — I83811 Varicose veins of right lower extremities with pain: Secondary | ICD-10-CM | POA: Diagnosis not present

## 2021-11-05 DIAGNOSIS — Z09 Encounter for follow-up examination after completed treatment for conditions other than malignant neoplasm: Secondary | ICD-10-CM | POA: Diagnosis not present

## 2021-11-05 DIAGNOSIS — I83891 Varicose veins of right lower extremities with other complications: Secondary | ICD-10-CM | POA: Diagnosis not present

## 2021-11-13 ENCOUNTER — Ambulatory Visit
Admission: RE | Admit: 2021-11-13 | Discharge: 2021-11-13 | Disposition: A | Discharge status: Home / Self Care | Payer: PPO | Source: Ambulatory Visit | Attending: Family Medicine | Admitting: Family Medicine

## 2021-11-13 ENCOUNTER — Other Ambulatory Visit: Payer: Self-pay

## 2021-11-13 DIAGNOSIS — Z1231 Encounter for screening mammogram for malignant neoplasm of breast: Secondary | ICD-10-CM | POA: Diagnosis not present

## 2021-11-26 NOTE — Patient Instructions (Signed)
DUE TO COVID-19 ONLY ONE VISITOR IS ALLOWED TO COME WITH YOU AND STAY IN THE WAITING ROOM ONLY DURING PRE OP AND PROCEDURE.   **NO VISITORS ARE ALLOWED IN THE SHORT STAY AREA OR RECOVERY ROOM!!**  IF YOU WILL BE ADMITTED INTO THE HOSPITAL YOU ARE ALLOWED ONLY TWO SUPPORT PEOPLE DURING VISITATION HOURS ONLY (7 AM -8PM)   The support person(s) must pass our screening, gel in and out, and wear a mask at all times, including in the patients room. Patients must also wear a mask when staff or their support person are in the room. Visitors GUEST BADGE MUST BE WORN VISIBLY  One adult visitor may remain with you overnight and MUST be in the room by 8 P.M.  No visitors under the age of 81. Any visitor under the age of 22 must be accompanied by an adult.    COVID SWAB TESTING MUST BE COMPLETED ON:  12/06/21                    8A - 3P **MUST PRESENT COMPLETED FORM AT TESTING SITE**    [facility] (backside of the building) You are not required to quarantine, however you are required to wear a well-fitted mask when you are out and around people not in your household.  Hand Hygiene often Do NOT share personal items Notify your provider if you are in close contact with someone who has COVID or you develop fever 100.4 or greater, new onset of sneezing, cough, sore throat, shortness of breath or body aches.  [place], [address], must go inside of the hospital, NOT A DRIVE THRU!  (Must self quarantine after testing. Follow instructions on handout.)       Your procedure is scheduled on: 12/10/21   Report to [facility] Main Entrance    Report to admitting at: 11:30 AM   Call this number if you have problems the morning of surgery [phone]   Do not eat food :After Midnight.   May have liquids until : 11:00 AM   day of surgery  CLEAR LIQUID DIET  Foods Allowed                                                                      Foods Excluded  Water, Black Coffee and tea, regular and decaf                             liquids that you cannot  Plain Jell-O in any flavor  (No red)                                           see through such as: Fruit ices (not with fruit pulp)                                     milk, soups, orange juice  Iced Popsicles (No red)                                    All solid food                                   Apple juices Sports drinks like Gatorade (No red) Lightly seasoned clear broth or consume(fat free) Sugar  Sample Menu Breakfast                                Lunch                                     Supper Cranberry juice                    Beef broth                            Chicken broth Jell-O                                     Grape juice                           Apple juice Coffee or tea                        Jell-O                                      Popsicle                                                Coffee or tea                        Coffee or tea      Complete one Ensure drink the morning of surgery at : 11:00 AM      the day of surgery.  The day of surgery:  Drink ONE (1) Pre-Surgery Clear Ensure or G2 by am the morning of surgery. Drink in one sitting. Do not sip.  This drink was given to you during your hospital  pre-op appointment visit. Nothing else to drink after completing the  Pre-Surgery Clear Ensure or G2.          If you have questions, please contact your surgeons office.     Oral Hygiene is also important to reduce your risk of infection.                                    Remember - BRUSH YOUR TEETH THE MORNING OF SURGERY WITH YOUR REGULAR TOOTHPASTE   Do NOT smoke after Midnight   Take these medicines the morning  of surgery with A SIP OF WATER: bupropion,synthroid,omeprazole,desvenlafaxine.  DO NOT TAKE ANY ORAL DIABETIC MEDICATIONS DAY OF YOUR SURGERY                              You  may not have any metal on your body including hair pins, jewelry, and body piercing             Do not wear make-up, lotions, powders, perfumes/cologne, or deodorant  Do not wear nail polish including gel and S&S, artificial/acrylic nails, or any other type of covering on natural nails including finger and toenails. If you have artificial nails, gel coating, etc. that needs to be removed by a nail salon please have this removed prior to surgery or surgery may need to be canceled/ delayed if the surgeon/ anesthesia feels like they are unable to be safely monitored.   Do not shave  48 hours prior to surgery.              Do not bring valuables to the hospital. [place].   Contacts, dentures or bridgework may not be worn into surgery.   Bring small overnight bag day of surgery.    Patients discharged on the day of surgery will not be allowed to drive home.   Special Instructions: Bring a copy of your healthcare power of attorney and living will documents         the day of surgery if you haven't scanned them before.              Please read over the following fact sheets you were given: IF YOU HAVE QUESTIONS ABOUT YOUR PRE-OP INSTRUCTIONS PLEASE CALL [phone]     [facility] - Preparing for Surgery Before surgery, you can play an important role.  Because skin is not sterile, your skin needs to be as free of germs as possible.  You can reduce the number of germs on your skin by washing with CHG (chlorahexidine gluconate) soap before surgery.  CHG is an antiseptic cleaner which kills germs and bonds with the skin to continue killing germs even after washing. Please DO NOT use if you have an allergy to CHG or antibacterial soaps.  If your skin becomes reddened/irritated stop using the CHG and inform your nurse when you arrive at Short Stay. Do not shave (including legs and underarms) for at least 48 hours prior to the first CHG shower.  You may  shave your face/neck. Please follow these instructions carefully:  1.  Shower with CHG Soap the night before surgery and the  morning of Surgery.  2.  If you choose to wash your hair, wash your hair first as usual with your  normal  shampoo.  3.  After you shampoo, rinse your hair and body thoroughly to remove the  shampoo.                           4.  Use CHG as you would any other liquid soap.  You can apply chg directly  to the skin and wash                       Gently with a scrungie or clean washcloth.  5.  Apply the CHG Soap to your body  ONLY FROM THE NECK DOWN.   Do not use on face/ open                           Wound or open sores. Avoid contact with eyes, ears mouth and genitals (private parts).                       Wash face,  Genitals (private parts) with your normal soap.             6.  Wash thoroughly, paying special attention to the area where your surgery  will be performed.  7.  Thoroughly rinse your body with warm water from the neck down.  8.  DO NOT shower/wash with your normal soap after using and rinsing off  the CHG Soap.                9.  Pat yourself dry with a clean towel.            10.  Wear clean pajamas.            11.  Place clean sheets on your bed the night of your first shower and do not  sleep with pets. Day of Surgery : Do not apply any lotions/deodorants the morning of surgery.  Please wear clean clothes to the hospital/surgery center.  FAILURE TO FOLLOW THESE INSTRUCTIONS MAY RESULT IN THE CANCELLATION OF YOUR SURGERY PATIENT SIGNATURE_________________________________  NURSE SIGNATURE__________________________________  ________________________________________________________________________   Sarah James  An incentive spirometer is a tool that can help keep your lungs clear and active. This tool measures how well you are filling your lungs with each breath. Taking long deep breaths may help reverse or decrease the chance of developing  breathing (pulmonary) problems (especially infection) following: A long period of time when you are unable to move or be active. BEFORE THE PROCEDURE  If the spirometer includes an indicator to show your best effort, your nurse or respiratory therapist will set it to a desired goal. If possible, sit up straight or lean slightly forward. Try not to slouch. Hold the incentive spirometer in an upright position. INSTRUCTIONS FOR USE  Sit on the edge of your bed if possible, or sit up as far as you can in bed or on a chair. Hold the incentive spirometer in an upright position. Breathe out normally. Place the mouthpiece in your mouth and seal your lips tightly around it. Breathe in slowly and as deeply as possible, raising the piston or the ball toward the top of the column. Hold your breath for 3-5 seconds or for as long as possible. Allow the piston or ball to fall to the bottom of the column. Remove the mouthpiece from your mouth and breathe out normally. Rest for a few seconds and repeat Steps 1 through 7 at least 10 times every 1-2 hours when you are awake. Take your time and take a few normal breaths between deep breaths. The spirometer may include an indicator to show your best effort. Use the indicator as a goal to work toward during each repetition. After each set of 10 deep breaths, practice coughing to be sure your lungs are clear. If you have an incision (the cut made at the time of surgery), support your incision when coughing by placing a pillow or rolled up towels firmly against it. Once you are able to get out of bed, walk around indoors and cough well.  You may stop using the incentive spirometer when instructed by your caregiver.  RISKS AND COMPLICATIONS Take your time so you do not get dizzy or light-headed. If you are in pain, you may need to take or ask for pain medication before doing incentive spirometry. It is harder to take a deep breath if you are having pain. AFTER USE Rest  and breathe slowly and easily. It can be helpful to keep track of a log of your progress. Your caregiver can provide you with a simple table to help with this. If you are using the spirometer at home, follow these instructions: [place] IF:  You are having difficultly using the spirometer. You have trouble using the spirometer as often as instructed. Your pain medication is not giving enough relief while using the spirometer. You develop fever of 100.5 F (38.1 C) or higher. SEEK IMMEDIATE MEDICAL CARE IF:  You cough up bloody sputum that had not been present before. You develop fever of 102 F (38.9 C) or greater. You develop worsening pain at or near the incision site. MAKE SURE YOU:  Understand these instructions. Will watch your condition. Will get help right away if you are not doing well or get worse. Document Released: 04/06/2007 Document Revised: 02/16/2012 Document Reviewed: 06/07/2007 [facility] Patient Information 2014 [place], [place].   ________________________________________________________________________

## 2021-11-27 ENCOUNTER — Other Ambulatory Visit: Payer: Self-pay

## 2021-11-27 ENCOUNTER — Encounter (HOSPITAL_COMMUNITY): Payer: Self-pay

## 2021-11-27 ENCOUNTER — Encounter (HOSPITAL_COMMUNITY)
Admission: RE | Admit: 2021-11-27 | Discharge: 2021-11-27 | Disposition: A | Discharge status: Home / Self Care | Payer: PPO | Source: Ambulatory Visit | Attending: Orthopedic Surgery | Admitting: Orthopedic Surgery

## 2021-11-27 VITALS — BP 115/76 | HR 73 | Temp 98.5°F | Ht 65.0 in | Wt 179.0 lb

## 2021-11-27 DIAGNOSIS — Z01812 Encounter for preprocedural laboratory examination: Secondary | ICD-10-CM | POA: Insufficient documentation

## 2021-11-27 DIAGNOSIS — M1611 Unilateral primary osteoarthritis, right hip: Secondary | ICD-10-CM | POA: Diagnosis not present

## 2021-11-27 DIAGNOSIS — Z01818 Encounter for other preprocedural examination: Secondary | ICD-10-CM

## 2021-11-27 HISTORY — DX: Anxiety disorder, unspecified: F41.9

## 2021-11-27 LAB — COMPREHENSIVE METABOLIC PANEL
ALT: 22 U/L (ref 0–44)
AST: 19 U/L (ref 15–41)
Albumin: 4.2 g/dL (ref 3.5–5.0)
Alkaline Phosphatase: 78 U/L (ref 38–126)
Anion gap: 6 (ref 5–15)
BUN: 22 mg/dL (ref 8–23)
CO2: 28 mmol/L (ref 22–32)
Calcium: 9.7 mg/dL (ref 8.9–10.3)
Chloride: 103 mmol/L (ref 98–111)
Creatinine, Ser: 0.69 mg/dL (ref 0.44–1.00)
GFR, Estimated: >60 mL/min (ref >60)
Glucose, Bld: 89 mg/dL (ref 70–99)
Potassium: 4.5 mmol/L (ref 3.5–5.1)
Sodium: 137 mmol/L (ref 135–145)
Total Bilirubin: 0.5 mg/dL (ref 0.3–1.2)
Total Protein: 7 g/dL (ref 6.5–8.1)

## 2021-11-27 LAB — CBC
HCT: 38.6 % (ref 36.0–46.0)
Hemoglobin: 12.4 g/dL (ref 12.0–15.0)
MCH: 29.6 pg (ref 26.0–34.0)
MCHC: 32.1 g/dL (ref 30.0–36.0)
MCV: 92.1 fL (ref 80.0–100.0)
Platelets: 272 10*3/uL (ref 150–400)
RBC: 4.19 MIL/uL (ref 3.87–5.11)
RDW: 14.1 % (ref 11.5–15.5)
WBC: 5.7 10*3/uL (ref 4.0–10.5)
nRBC: 0 % (ref 0.0–0.2)

## 2021-11-27 LAB — SURGICAL PCR SCREEN
MRSA, PCR: NEGATIVE
Staphylococcus aureus: POSITIVE — AB

## 2021-11-27 LAB — TYPE AND SCREEN
ABO/RH(D): A NEG
Antibody Screen: NEGATIVE

## 2021-11-27 NOTE — Progress Notes (Signed)
COVID Vaccine Completed: Yes Date COVID Vaccine completed: 2022 x 4 COVID vaccine manufacturer:  Moderna    COVID Test: 12/06/21 PCP - Dr. Lujean Amel Cardiologist -   Chest x-ray -  EKG -  Stress Test -  ECHO -  Cardiac Cath -  Pacemaker/ICD device last checked:  Sleep Study -  CPAP -   Fasting Blood Sugar -  Checks Blood Sugar _____ times a day  Blood Thinner Instructions: Aspirin Instructions: Last Dose:  Anesthesia review:   Patient denies shortness of breath, fever, cough and chest pain at PAT appointment   Patient verbalized understanding of instructions that were given to them at the PAT appointment. Patient was also instructed that they will need to review over the PAT instructions again at home before surgery.

## 2021-11-28 NOTE — Progress Notes (Signed)
PCR: + STAPH °

## 2021-12-06 ENCOUNTER — Other Ambulatory Visit: Payer: Self-pay | Admitting: Orthopedic Surgery

## 2021-12-06 DIAGNOSIS — F900 Attention-deficit hyperactivity disorder, predominantly inattentive type: Secondary | ICD-10-CM | POA: Diagnosis not present

## 2021-12-06 DIAGNOSIS — M170 Bilateral primary osteoarthritis of knee: Secondary | ICD-10-CM | POA: Diagnosis not present

## 2021-12-06 DIAGNOSIS — F321 Major depressive disorder, single episode, moderate: Secondary | ICD-10-CM | POA: Diagnosis not present

## 2021-12-06 DIAGNOSIS — Z01818 Encounter for other preprocedural examination: Secondary | ICD-10-CM | POA: Diagnosis not present

## 2021-12-06 DIAGNOSIS — E039 Hypothyroidism, unspecified: Secondary | ICD-10-CM | POA: Diagnosis not present

## 2021-12-06 DIAGNOSIS — D509 Iron deficiency anemia, unspecified: Secondary | ICD-10-CM | POA: Diagnosis not present

## 2021-12-06 DIAGNOSIS — Z131 Encounter for screening for diabetes mellitus: Secondary | ICD-10-CM | POA: Diagnosis not present

## 2021-12-06 LAB — SARS CORONAVIRUS 2 (TAT 6-24 HRS): SARS Coronavirus 2: NEGATIVE

## 2021-12-09 NOTE — H&P (Signed)
TOTAL HIP ADMISSION H&P  Patient is admitted for right total hip arthroplasty.  Subjective:  Chief Complaint: right hip pain  HPI: Sarah James, 71 y.o. female, has a history of pain and functional disability in the right hip(s) due to arthritis and patient has failed non-surgical conservative treatments for greater than 12 weeks to include NSAID's and/or analgesics and activity modification.  Onset of symptoms was gradual starting 2 years ago with gradually worsening course since that time.The patient noted no past surgery on the right hip(s).  Patient currently rates pain in the right hip at 8 out of 10 with activity. Patient has worsening of pain with activity and weight bearing, pain that interfers with activities of daily living, and pain with passive range of motion. Patient has evidence of joint space narrowing by imaging studies. This condition presents safety issues increasing the risk of falls. There is no current active infection.  Patient Active Problem List   Diagnosis Date Noted   Pain in right knee 12/11/2017   Obese 10/21/2017   S/P right TKA 10/20/2017   ADD (attention deficit disorder) 07/27/2017   Insomnia 07/27/2017   Depression, major, single episode, in partial remission ([place]) 07/27/2017   Hypothyroidism 07/27/2017   S/P left THA, AA 09/30/2016   Past Medical History:  Diagnosis Date   ADD (attention deficit disorder)    Anemia    hx of    Anxiety    Arthritis    Depression    GERD (gastroesophageal reflux disease)    Heartburn    Hypothyroidism     Past Surgical History:  Procedure Laterality Date   deviated septum repair     KNEE ARTHROSCOPY Right 2011   left knee arthroscopy      TOTAL HIP ARTHROPLASTY Left 09/30/2016   Procedure: LEFT TOTAL HIP ARTHROPLASTY ANTERIOR APPROACH;  Surgeon: Paralee Cancel, MD;  Location: [facility];  Service: Orthopedics;  Laterality: Left;   TOTAL KNEE ARTHROPLASTY Right 10/20/2017   Procedure: RIGHT TOTAL KNEE  ARTHROPLASTY;  Surgeon: Paralee Cancel, MD;  Location: [facility];  Service: Orthopedics;  Laterality: Right;   VARICOSE VEIN SURGERY      No current facility-administered medications for this encounter.   Current Outpatient Medications  Medication Sig Dispense Refill Last Dose   amphetamine-dextroamphetamine (ADDERALL XR) 30 MG 24 hr capsule Take 30 mg by mouth daily.      Ascorbic Acid (VITAMIN C) 1000 MG tablet Take 1,000 mg by mouth daily.      b complex vitamins capsule Take 1 capsule by mouth daily.      calcium carbonate (OS-CAL) 600 MG TABS tablet Take 600 mg by mouth daily with breakfast.      Carboxymethylcellul-Glycerin (LUBRICATING EYE DROPS OP) Apply 1 drop to eye at bedtime as needed (dry eyes).      Cholecalciferol (VITAMIN D PO) Take 2,000 Units by mouth daily.      Desvenlafaxine Succinate ER 25 MG TB24 Take 25 mg by mouth daily.      levothyroxine (SYNTHROID, LEVOTHROID) 88 MCG tablet Take 88 mcg by mouth daily before breakfast.      MAGNESIUM PO Take 1 tablet by mouth daily.      Multiple Vitamin (MULTIVITAMIN) capsule Take 1 capsule by mouth daily.      Nutritional Supplements (IMMUNE ENHANCE) TABS Take 2 tablets by mouth daily.      omeprazole (PRILOSEC OTC) 20 MG tablet Take 20 mg by mouth daily as needed (acid reflux).  polyethylene glycol (MIRALAX / GLYCOLAX) packet Take 17 g 2 (two) times daily by mouth. (Patient taking differently: Take 17 g by mouth daily as needed for mild constipation.) 14 each 0    Probiotic Product (PROBIOTIC-10 ULTIMATE PO) Take 1 tablet by mouth daily.      traZODone (DESYREL) 100 MG tablet Take 50 mg by mouth at bedtime as needed for sleep.      Turmeric 500 MG CAPS Take 500 mg by mouth daily.      zinc gluconate 50 MG tablet Take 50 mg by mouth daily.      buPROPion (WELLBUTRIN XL) 150 MG 24 hr tablet Take 1 tablet (150 mg total) by mouth daily. 90 tablet 0    hepatitis A virus, PF, vaccine (HAVRIX) 1440 EL U/ML injection Havrix (PF) 1,440  ELISA unit/mL intramuscular syringe      Influenza Vac High-Dose Quad (FLUZONE HIGH-DOSE QUADRIVALENT) 0.7 ML SUSY Fluzone High-Dose Quad 2020-21 (PF) 240 mcg/0.7 mL IM syringe      pneumococcal 23 valent vaccine (PNEUMOVAX 23) 25 MCG/0.5ML injection Pneumovax-23 25 mcg/0.5 mL injection syringe      No Known Allergies  Social History   Tobacco Use   Smoking status: Former    Types: Cigarettes    Quit date: 12/08/1990    Years since quitting: 31.0   Smokeless tobacco: Never  Substance Use Topics   Alcohol use: Yes    Alcohol/week: 0.0 standard drinks    Comment: occasional     Family History  Problem Relation Age of Onset   Arthritis Mother    Hypertension Mother    Lung cancer Father    Stroke Maternal Grandmother    Hypertension Maternal Grandmother    Heart disease Paternal Grandfather    Breast cancer Neg Hx      Review of Systems  Constitutional:  Negative for chills and fever.  Respiratory:  Negative for cough and shortness of breath.   Cardiovascular:  Negative for chest pain.  Gastrointestinal:  Negative for nausea and vomiting.  Musculoskeletal:  Positive for arthralgias.    Objective:  Physical Exam Well nourished and well developed. General: Alert and oriented x3, cooperative and pleasant, no acute distress. Head: normocephalic, atraumatic, neck supple. Eyes: EOMI.  Musculoskeletal: Right hip exam: Mild tenderness laterally, improved from prior injection Passive range of motion of the hip reveals relative maintenance of her motion greater than 10 degrees with some reproducible pain and lesser to an extent with external rotation   Calves soft and nontender. Motor function intact in LE. Strength 5/5 LE bilaterally. Neuro: Distal pulses 2+. Sensation to light touch intact in LE.  Vital signs in last 24 hours:    Labs:   Estimated body mass index is 29.79 kg/m as calculated from the following:   Height as of 11/27/21: 5\' 5"  (1.651 m).   Weight as of  11/27/21: 81.2 kg.   Imaging Review Plain radiographs demonstrate severe degenerative joint disease of the right hip(s). The bone quality appears to be adequate for age and reported activity level.      Assessment/Plan:  End stage arthritis, right hip(s)  The patient history, physical examination, clinical judgement of the provider and imaging studies are consistent with end stage degenerative joint disease of the right hip(s) and total hip arthroplasty is deemed medically necessary. The treatment options including medical management, injection therapy, arthroscopy and arthroplasty were discussed at length. The risks and benefits of total hip arthroplasty were presented and reviewed. The risks due to  aseptic loosening, infection, stiffness, dislocation/subluxation,  thromboembolic complications and other imponderables were discussed.  The patient acknowledged the explanation, agreed to proceed with the plan and consent was signed. Patient is being admitted for inpatient treatment for surgery, pain control, PT, OT, prophylactic antibiotics, VTE prophylaxis, progressive ambulation and ADL's and discharge planning.The patient is planning to be discharged  home.  Therapy Plans: HEP Disposition: Home with husband Planned DVT Prophylaxis: aspirin 81mg  BID DME needed: rolling walker, 3-n-1 PCP: Dr. Dorthy Cooler, clearance received TXA: IV Allergies: NKDA Anesthesia Concerns: none BMI: 31.5 Last HgbA1c: Not diabetic  Other: - Norco, robaxin, celebrex   Costella Hatcher, PA-C Orthopedic Surgery [facility] [place] [phone]

## 2021-12-10 ENCOUNTER — Observation Stay (HOSPITAL_COMMUNITY): Payer: PPO

## 2021-12-10 ENCOUNTER — Encounter (HOSPITAL_COMMUNITY)
Admission: RE | Disposition: A | Discharge status: Home / Self Care | Payer: Self-pay | Source: Ambulatory Visit | Attending: Orthopedic Surgery

## 2021-12-10 ENCOUNTER — Ambulatory Visit (HOSPITAL_COMMUNITY): Payer: PPO | Admitting: Registered Nurse

## 2021-12-10 ENCOUNTER — Observation Stay (HOSPITAL_COMMUNITY)
Admission: RE | Admit: 2021-12-10 | Discharge: 2021-12-11 | Disposition: A | Discharge status: Home / Self Care | Payer: PPO | Source: Ambulatory Visit | Attending: Orthopedic Surgery | Admitting: Orthopedic Surgery

## 2021-12-10 ENCOUNTER — Other Ambulatory Visit: Payer: Self-pay

## 2021-12-10 ENCOUNTER — Encounter (HOSPITAL_COMMUNITY): Payer: Self-pay | Admitting: Orthopedic Surgery

## 2021-12-10 ENCOUNTER — Ambulatory Visit (HOSPITAL_COMMUNITY): Payer: PPO

## 2021-12-10 DIAGNOSIS — Z79899 Other long term (current) drug therapy: Secondary | ICD-10-CM | POA: Diagnosis not present

## 2021-12-10 DIAGNOSIS — Z96651 Presence of right artificial knee joint: Secondary | ICD-10-CM | POA: Insufficient documentation

## 2021-12-10 DIAGNOSIS — Z96642 Presence of left artificial hip joint: Secondary | ICD-10-CM | POA: Diagnosis not present

## 2021-12-10 DIAGNOSIS — Z471 Aftercare following joint replacement surgery: Secondary | ICD-10-CM | POA: Diagnosis not present

## 2021-12-10 DIAGNOSIS — E039 Hypothyroidism, unspecified: Secondary | ICD-10-CM | POA: Diagnosis not present

## 2021-12-10 DIAGNOSIS — Z96649 Presence of unspecified artificial hip joint: Secondary | ICD-10-CM

## 2021-12-10 DIAGNOSIS — Z87891 Personal history of nicotine dependence: Secondary | ICD-10-CM | POA: Insufficient documentation

## 2021-12-10 DIAGNOSIS — M1611 Unilateral primary osteoarthritis, right hip: Principal | ICD-10-CM | POA: Insufficient documentation

## 2021-12-10 DIAGNOSIS — Z01818 Encounter for other preprocedural examination: Secondary | ICD-10-CM

## 2021-12-10 DIAGNOSIS — Z96641 Presence of right artificial hip joint: Secondary | ICD-10-CM | POA: Diagnosis not present

## 2021-12-10 DIAGNOSIS — S72041A Displaced fracture of base of neck of right femur, initial encounter for closed fracture: Secondary | ICD-10-CM

## 2021-12-10 HISTORY — PX: TOTAL HIP ARTHROPLASTY: SHX124

## 2021-12-10 SURGERY — ARTHROPLASTY, HIP, TOTAL, ANTERIOR APPROACH
Anesthesia: Spinal | Site: Hip | Laterality: Right

## 2021-12-10 MED ORDER — DIPHENHYDRAMINE HCL 12.5 MG/5ML PO ELIX: 12.5000 mg | ORAL_SOLUTION | ORAL | Status: DC | PRN

## 2021-12-10 MED ORDER — ORAL CARE MOUTH RINSE: 15.0000 mL | Freq: Once | OROMUCOSAL | Status: AC

## 2021-12-10 MED ORDER — HYDROCODONE-ACETAMINOPHEN 7.5-325 MG PO TABS
ORAL_TABLET | ORAL | Status: AC
  Filled 2021-12-10: qty 2

## 2021-12-10 MED ORDER — STERILE WATER FOR IRRIGATION IR SOLN
Status: DC | PRN
  Administered 2021-12-10: 2000 mL

## 2021-12-10 MED ORDER — PROPOFOL 1000 MG/100ML IV EMUL
INTRAVENOUS | Status: AC
  Filled 2021-12-10: qty 100

## 2021-12-10 MED ORDER — METOCLOPRAMIDE HCL 5 MG/ML IJ SOLN: 5.0000 mg | Freq: Three times a day (TID) | INTRAMUSCULAR | Status: DC | PRN

## 2021-12-10 MED ORDER — PROMETHAZINE HCL 25 MG/ML IJ SOLN: 6.2500 mg | INTRAMUSCULAR | Status: DC | PRN

## 2021-12-10 MED ORDER — PHENYLEPHRINE HCL-NACL 20-0.9 MG/250ML-% IV SOLN
INTRAVENOUS | Status: DC | PRN
  Administered 2021-12-10: 50 ug/min via INTRAVENOUS

## 2021-12-10 MED ORDER — PROPOFOL 500 MG/50ML IV EMUL
INTRAVENOUS | Status: DC | PRN
  Administered 2021-12-10: 75 ug/kg/min via INTRAVENOUS

## 2021-12-10 MED ORDER — BISACODYL 10 MG RE SUPP: 10.0000 mg | Freq: Every day | RECTAL | Status: DC | PRN

## 2021-12-10 MED ORDER — MORPHINE SULFATE (PF) 2 MG/ML IV SOLN
0.5000 mg | INTRAVENOUS | Status: DC | PRN
  Administered 2021-12-10 (×2): 1 mg via INTRAVENOUS

## 2021-12-10 MED ORDER — PHENYLEPHRINE 40 MCG/ML (10ML) SYRINGE FOR IV PUSH (FOR BLOOD PRESSURE SUPPORT)
PREFILLED_SYRINGE | INTRAVENOUS | Status: AC
  Filled 2021-12-10: qty 10

## 2021-12-10 MED ORDER — CELECOXIB 200 MG PO CAPS
200.0000 mg | ORAL_CAPSULE | Freq: Two times a day (BID) | ORAL | Status: DC
  Administered 2021-12-10 – 2021-12-11 (×2): 200 mg via ORAL
  Filled 2021-12-10 (×2): qty 1

## 2021-12-10 MED ORDER — POVIDONE-IODINE 10 % EX SWAB
2.0000 "application " | Freq: Once | CUTANEOUS | Status: AC
  Administered 2021-12-10: 2 via TOPICAL

## 2021-12-10 MED ORDER — MORPHINE SULFATE (PF) 2 MG/ML IV SOLN
INTRAVENOUS | Status: AC
  Filled 2021-12-10: qty 1

## 2021-12-10 MED ORDER — ONDANSETRON HCL 4 MG PO TABS
4.0000 mg | ORAL_TABLET | Freq: Four times a day (QID) | ORAL | Status: DC | PRN
  Administered 2021-12-11: 4 mg via ORAL
  Filled 2021-12-10 (×2): qty 1

## 2021-12-10 MED ORDER — CHLORHEXIDINE GLUCONATE 0.12 % MT SOLN
15.0000 mL | Freq: Once | OROMUCOSAL | Status: AC
  Administered 2021-12-10: 15 mL via OROMUCOSAL

## 2021-12-10 MED ORDER — DEXAMETHASONE SODIUM PHOSPHATE 10 MG/ML IJ SOLN
8.0000 mg | Freq: Once | INTRAMUSCULAR | Status: AC
  Administered 2021-12-10: 8 mg via INTRAVENOUS

## 2021-12-10 MED ORDER — METHOCARBAMOL 500 MG PO TABS: 500.0000 mg | ORAL_TABLET | Freq: Four times a day (QID) | ORAL | Status: DC | PRN

## 2021-12-10 MED ORDER — HYDROMORPHONE HCL 1 MG/ML IJ SOLN
INTRAMUSCULAR | Status: AC
  Filled 2021-12-10: qty 1

## 2021-12-10 MED ORDER — ONDANSETRON HCL 4 MG/2ML IJ SOLN: 4.0000 mg | Freq: Four times a day (QID) | INTRAMUSCULAR | Status: DC | PRN

## 2021-12-10 MED ORDER — 0.9 % SODIUM CHLORIDE (POUR BTL) OPTIME
TOPICAL | Status: DC | PRN
  Administered 2021-12-10: 1000 mL

## 2021-12-10 MED ORDER — DOCUSATE SODIUM 100 MG PO CAPS
100.0000 mg | ORAL_CAPSULE | Freq: Two times a day (BID) | ORAL | Status: DC
  Administered 2021-12-10 – 2021-12-11 (×2): 100 mg via ORAL
  Filled 2021-12-10 (×2): qty 1

## 2021-12-10 MED ORDER — BUPIVACAINE IN DEXTROSE 0.75-8.25 % IT SOLN
INTRATHECAL | Status: DC | PRN
  Administered 2021-12-10: 1.6 mL via INTRATHECAL

## 2021-12-10 MED ORDER — POLYETHYLENE GLYCOL 3350 17 G PO PACK: 17.0000 g | PACK | Freq: Every day | ORAL | Status: DC | PRN

## 2021-12-10 MED ORDER — PHENOL 1.4 % MT LIQD: 1.0000 | OROMUCOSAL | Status: DC | PRN

## 2021-12-10 MED ORDER — HYDROMORPHONE HCL 1 MG/ML IJ SOLN: 0.5000 mg | INTRAMUSCULAR | Status: DC | PRN

## 2021-12-10 MED ORDER — METHOCARBAMOL 500 MG IVPB - SIMPLE MED
INTRAVENOUS | Status: AC
  Filled 2021-12-10: qty 50

## 2021-12-10 MED ORDER — PHENYLEPHRINE 40 MCG/ML (10ML) SYRINGE FOR IV PUSH (FOR BLOOD PRESSURE SUPPORT)
PREFILLED_SYRINGE | INTRAVENOUS | Status: DC | PRN
  Administered 2021-12-10: 80 ug via INTRAVENOUS

## 2021-12-10 MED ORDER — LACTATED RINGERS IV SOLN: INTRAVENOUS | Status: DC

## 2021-12-10 MED ORDER — MIDAZOLAM HCL 2 MG/2ML IJ SOLN
INTRAMUSCULAR | Status: AC
  Filled 2021-12-10: qty 2

## 2021-12-10 MED ORDER — METHOCARBAMOL 500 MG IVPB - SIMPLE MED
500.0000 mg | Freq: Four times a day (QID) | INTRAVENOUS | Status: DC | PRN
  Administered 2021-12-10: 500 mg via INTRAVENOUS
  Filled 2021-12-10: qty 50

## 2021-12-10 MED ORDER — FENTANYL CITRATE (PF) 100 MCG/2ML IJ SOLN
INTRAMUSCULAR | Status: DC | PRN
  Administered 2021-12-10 (×2): 50 ug via INTRAVENOUS

## 2021-12-10 MED ORDER — HYDROMORPHONE HCL 1 MG/ML IJ SOLN
0.2500 mg | INTRAMUSCULAR | Status: DC | PRN
  Administered 2021-12-10 (×2): 0.5 mg via INTRAVENOUS

## 2021-12-10 MED ORDER — ONDANSETRON HCL 4 MG/2ML IJ SOLN
INTRAMUSCULAR | Status: AC
  Filled 2021-12-10: qty 2

## 2021-12-10 MED ORDER — TRAZODONE HCL 50 MG PO TABS
50.0000 mg | ORAL_TABLET | Freq: Every evening | ORAL | Status: DC | PRN
  Administered 2021-12-10: 50 mg via ORAL
  Filled 2021-12-10: qty 1

## 2021-12-10 MED ORDER — HYDROCODONE-ACETAMINOPHEN 5-325 MG PO TABS
1.0000 | ORAL_TABLET | ORAL | Status: DC | PRN
  Administered 2021-12-11: 2 via ORAL
  Filled 2021-12-10: qty 2

## 2021-12-10 MED ORDER — METOCLOPRAMIDE HCL 5 MG PO TABS
5.0000 mg | ORAL_TABLET | Freq: Three times a day (TID) | ORAL | Status: DC | PRN
  Filled 2021-12-10: qty 2

## 2021-12-10 MED ORDER — ASPIRIN 81 MG PO CHEW
81.0000 mg | CHEWABLE_TABLET | Freq: Two times a day (BID) | ORAL | Status: DC
  Administered 2021-12-10 – 2021-12-11 (×2): 81 mg via ORAL
  Filled 2021-12-10 (×2): qty 1

## 2021-12-10 MED ORDER — DEXAMETHASONE SODIUM PHOSPHATE 10 MG/ML IJ SOLN
10.0000 mg | Freq: Once | INTRAMUSCULAR | Status: AC
  Administered 2021-12-11: 10 mg via INTRAVENOUS
  Filled 2021-12-10: qty 1

## 2021-12-10 MED ORDER — PHENYLEPHRINE HCL-NACL 20-0.9 MG/250ML-% IV SOLN
INTRAVENOUS | Status: AC
  Filled 2021-12-10: qty 250

## 2021-12-10 MED ORDER — MENTHOL 3 MG MT LOZG: 1.0000 | LOZENGE | OROMUCOSAL | Status: DC | PRN

## 2021-12-10 MED ORDER — MIDAZOLAM HCL 5 MG/5ML IJ SOLN
INTRAMUSCULAR | Status: DC | PRN
  Administered 2021-12-10: 2 mg via INTRAVENOUS

## 2021-12-10 MED ORDER — DEXAMETHASONE SODIUM PHOSPHATE 10 MG/ML IJ SOLN
INTRAMUSCULAR | Status: AC
  Filled 2021-12-10: qty 1

## 2021-12-10 MED ORDER — SODIUM CHLORIDE 0.9 % IV SOLN: INTRAVENOUS | Status: DC

## 2021-12-10 MED ORDER — ACETAMINOPHEN 325 MG PO TABS: 325.0000 mg | ORAL_TABLET | Freq: Four times a day (QID) | ORAL | Status: DC | PRN

## 2021-12-10 MED ORDER — ONDANSETRON HCL 4 MG/2ML IJ SOLN
INTRAMUSCULAR | Status: DC | PRN
Start: 2021-12-10 — End: 2021-12-10
  Administered 2021-12-10: 4 mg via INTRAVENOUS

## 2021-12-10 MED ORDER — OMEPRAZOLE 20 MG PO CPDR
20.0000 mg | DELAYED_RELEASE_CAPSULE | Freq: Every day | ORAL | Status: DC | PRN
  Filled 2021-12-10: qty 1

## 2021-12-10 MED ORDER — HYDROCODONE-ACETAMINOPHEN 7.5-325 MG PO TABS
1.0000 | ORAL_TABLET | ORAL | Status: DC | PRN
  Administered 2021-12-10 – 2021-12-11 (×3): 2 via ORAL
  Filled 2021-12-10 (×2): qty 2

## 2021-12-10 MED ORDER — CEFAZOLIN SODIUM-DEXTROSE 2-4 GM/100ML-% IV SOLN
2.0000 g | Freq: Four times a day (QID) | INTRAVENOUS | Status: AC
  Administered 2021-12-10 – 2021-12-11 (×2): 2 g via INTRAVENOUS
  Filled 2021-12-10 (×2): qty 100

## 2021-12-10 MED ORDER — TRANEXAMIC ACID-NACL 1000-0.7 MG/100ML-% IV SOLN
1000.0000 mg | Freq: Once | INTRAVENOUS | Status: AC
  Administered 2021-12-10: 1000 mg via INTRAVENOUS

## 2021-12-10 MED ORDER — CEFAZOLIN SODIUM-DEXTROSE 2-4 GM/100ML-% IV SOLN
2.0000 g | INTRAVENOUS | Status: AC
  Administered 2021-12-10: 2 g via INTRAVENOUS
  Filled 2021-12-10: qty 100

## 2021-12-10 MED ORDER — FENTANYL CITRATE (PF) 100 MCG/2ML IJ SOLN
INTRAMUSCULAR | Status: AC
  Filled 2021-12-10: qty 2

## 2021-12-10 MED ORDER — HYDROMORPHONE HCL 1 MG/ML IJ SOLN
1.0000 mg | Freq: Once | INTRAMUSCULAR | Status: AC
  Administered 2021-12-10: 1 mg via INTRAVENOUS

## 2021-12-10 MED ORDER — LACTATED RINGERS IV SOLN
INTRAVENOUS | Status: DC
Start: 2021-12-10 — End: 2021-12-10

## 2021-12-10 MED ORDER — TRANEXAMIC ACID-NACL 1000-0.7 MG/100ML-% IV SOLN
INTRAVENOUS | Status: AC
  Filled 2021-12-10: qty 100

## 2021-12-10 MED ORDER — OMEPRAZOLE MAGNESIUM 20 MG PO TBEC: 20.0000 mg | DELAYED_RELEASE_TABLET | Freq: Every day | ORAL | Status: DC | PRN

## 2021-12-10 MED ORDER — FERROUS SULFATE 325 (65 FE) MG PO TABS
325.0000 mg | ORAL_TABLET | Freq: Three times a day (TID) | ORAL | Status: DC
  Administered 2021-12-11: 325 mg via ORAL
  Filled 2021-12-10 (×2): qty 1

## 2021-12-10 MED ORDER — TRANEXAMIC ACID-NACL 1000-0.7 MG/100ML-% IV SOLN
1000.0000 mg | INTRAVENOUS | Status: AC
  Administered 2021-12-10: 1000 mg via INTRAVENOUS
  Filled 2021-12-10: qty 100

## 2021-12-10 SURGICAL SUPPLY — 43 items
ADH SKN CLS APL DERMABOND .7 (GAUZE/BANDAGES/DRESSINGS) ×1
BAG COUNTER SPONGE SURGICOUNT (BAG) IMPLANT
BAG DECANTER FOR FLEXI CONT (MISCELLANEOUS) IMPLANT
BAG SPEC THK2 15X12 ZIP CLS (MISCELLANEOUS)
BAG SPNG CNTER NS LX DISP (BAG)
BAG ZIPLOCK 12X15 (MISCELLANEOUS) IMPLANT
BLADE SAG 18X100X1.27 (BLADE) ×2 IMPLANT
COVER PERINEAL POST (MISCELLANEOUS) ×2 IMPLANT
COVER SURGICAL LIGHT HANDLE (MISCELLANEOUS) ×2 IMPLANT
CUP ACETBLR 52 OD PINNACLE (Hips) ×1 IMPLANT
DERMABOND ADVANCED (GAUZE/BANDAGES/DRESSINGS) ×1
DERMABOND ADVANCED .7 DNX12 (GAUZE/BANDAGES/DRESSINGS) ×1 IMPLANT
DRAPE FOOT SWITCH (DRAPES) ×2 IMPLANT
DRAPE STERI IOBAN 125X83 (DRAPES) ×2 IMPLANT
DRAPE U-SHAPE 47X51 STRL (DRAPES) ×4 IMPLANT
DRESSING AQUACEL AG SP 3.5X10 (GAUZE/BANDAGES/DRESSINGS) ×1 IMPLANT
DRSG AQUACEL AG ADV 3.5X10 (GAUZE/BANDAGES/DRESSINGS) ×1 IMPLANT
DRSG AQUACEL AG SP 3.5X10 (GAUZE/BANDAGES/DRESSINGS) ×2
DURAPREP 26ML APPLICATOR (WOUND CARE) ×2 IMPLANT
ELECT REM PT RETURN 15FT ADLT (MISCELLANEOUS) ×2 IMPLANT
ELIMINATOR HOLE APEX DEPUY (Hips) ×1 IMPLANT
GLOVE SURG ENC MOIS LTX SZ6 (GLOVE) ×2 IMPLANT
GLOVE SURG ENC MOIS LTX SZ7 (GLOVE) ×2 IMPLANT
GLOVE SURG UNDER LTX SZ6.5 (GLOVE) ×2 IMPLANT
GLOVE SURG UNDER POLY LF SZ7.5 (GLOVE) ×2 IMPLANT
GOWN STRL REUS W/TWL LRG LVL3 (GOWN DISPOSABLE) ×4 IMPLANT
HEAD CERAMIC 36 PLUS5 (Hips) ×1 IMPLANT
HOLDER FOLEY CATH W/STRAP (MISCELLANEOUS) ×2 IMPLANT
KIT TURNOVER KIT A (KITS) IMPLANT
LINER NEUTRAL 52X36MM PLUS 4 (Liner) ×1 IMPLANT
PACK ANTERIOR HIP CUSTOM (KITS) ×2 IMPLANT
SCREW 6.5MMX25MM (Screw) ×1 IMPLANT
SPONGE T-LAP 18X18 ~~LOC~~+RFID (SPONGE) ×6 IMPLANT
STEM FEMORAL SZ 5MM STD ACTIS (Stem) ×1 IMPLANT
SUT MNCRL AB 4-0 PS2 18 (SUTURE) ×2 IMPLANT
SUT STRATAFIX 0 PDS 27 VIOLET (SUTURE) ×2
SUT VIC AB 1 CT1 36 (SUTURE) ×6 IMPLANT
SUT VIC AB 2-0 CT1 27 (SUTURE) ×4
SUT VIC AB 2-0 CT1 TAPERPNT 27 (SUTURE) ×2 IMPLANT
SUTURE STRATFX 0 PDS 27 VIOLET (SUTURE) ×1 IMPLANT
TRAY FOLEY MTR SLVR 16FR STAT (SET/KITS/TRAYS/PACK) IMPLANT
TUBE SUCTION HIGH CAP CLEAR NV (SUCTIONS) ×2 IMPLANT
WATER STERILE IRR 1000ML POUR (IV SOLUTION) ×2 IMPLANT

## 2021-12-10 NOTE — Care Plan (Signed)
Ortho Bundle Case Management Note  Patient Details  Name: Sarah James MRN: [id number] Date of Birth: 10/10/1951                  R THA on 12-10-21 DCP: Home with husband. DME: No needs, has a RW and 3-in-1 PT: HEP   DME Arranged:  N/A DME Agency:     HH Arranged:    [place] Agency:     Additional Comments: Please contact me with any questions of if this plan should need to change.  Marianne Sofia, RN,CCM [facility]  [phone] 12/10/2021, 4:49 PM

## 2021-12-10 NOTE — Op Note (Signed)
NAME:  [name]                ACCOUNT NO.: [id number]      MEDICAL RECORD NO.: [id number]      FACILITY:  [facility]      PHYSICIAN:  Mauri Pole  DATE OF BIRTH:  1951-10-01     DATE OF PROCEDURE:  12/10/2021                                 OPERATIVE REPORT         PREOPERATIVE DIAGNOSIS: Right  hip osteoarthritis.      POSTOPERATIVE DIAGNOSIS:  Right hip osteoarthritis.      PROCEDURE:  Right total hip replacement through an anterior approach   utilizing DePuy THR system, component size 52 mm pinnacle cup, a size 36+4 neutral   Altrex liner, a size 5 standard Actis stem with a 36+5 delta ceramic   ball.      SURGEON:  Pietro Cassis. Alvan Dame, M.D.      ASSISTANT:  Costella Hatcher, PA-C     ANESTHESIA:  Spinal.      SPECIMENS:  None.      COMPLICATIONS:  None.      BLOOD LOSS:  500 cc     DRAINS:  None.      INDICATION OF THE PROCEDURE:  Sarah James is a 71 y.o. female who had   presented to office for evaluation of left hip pain.  Radiographs revealed   progressive degenerative changes with bone-on-bone   articulation of the  hip joint, including subchondral cystic changes and osteophytes.  The patient had painful limited range of   motion significantly affecting their overall quality of life and function.  The patient was failing to    respond to conservative measures including medications and/or injections and activity modification and at this point was ready   to proceed with more definitive measures.  Consent was obtained for   benefit of pain relief.  Specific risks of infection, DVT, component   failure, dislocation, neurovascular injury, and need for revision surgery were reviewed in the office as well discussion of   the anterior versus posterior approach were reviewed.     PROCEDURE IN DETAIL:  The patient was brought to operative theater.   Once adequate anesthesia, preoperative antibiotics, 2 gm of Ancef, 1 gm of Tranexamic Acid, and  10 mg of Decadron were administered, the patient was positioned supine on the [employer] table.  Once the patient was safely positioned with adequate padding of boney prominences we predraped out the hip, and used fluoroscopy to confirm orientation of the pelvis.      The right hip was then prepped and draped from proximal iliac crest to   mid thigh with a shower curtain technique.      Time-out was performed identifying the patient, planned procedure, and the appropriate extremity.     An incision was then made 2 cm lateral to the   anterior superior iliac spine extending over the orientation of the   tensor fascia lata muscle and sharp dissection was carried down to the   fascia of the muscle.      The fascia was then incised.  The muscle belly was identified and swept   laterally and retractor placed along the superior neck.  Following   cauterization of the circumflex vessels and removing some  pericapsular   fat, a second cobra retractor was placed on the inferior neck.  A T-capsulotomy was made along the line of the   superior neck to the trochanteric fossa, then extended proximally and   distally.  Tag sutures were placed and the retractors were then placed   intracapsular.  We then identified the trochanteric fossa and   orientation of my neck cut and then made a neck osteotomy with the femur on traction.  The femoral   head was removed without difficulty or complication.  Traction was let   off and retractors were placed posterior and anterior around the   acetabulum.      The labrum and foveal tissue were debrided.  I began reaming with a 45 mm   reamer and reamed up to 51 mm reamer with good bony bed preparation and a 52 mm  cup was chosen.  The final 52 mm Pinnacle cup was then impacted under fluoroscopy to confirm the depth of penetration and orientation with respect to   Abduction and forward flexion.  A screw was placed into the ilium followed by the hole eliminator.  The  final   36+4 neutral Altrex liner was impacted with good visualized rim fit.  The cup was positioned anatomically within the acetabular portion of the pelvis.      At this point, the femur was rolled to 100 degrees.  Further capsule was   released off the inferior aspect of the femoral neck.  I then   released the superior capsule proximally.  With the leg in a neutral position the hook was placed laterally   along the femur under the vastus lateralis origin and elevated manually and then held in position using the hook attachment on the bed.  The leg was then extended and adducted with the leg rolled to 100   degrees of external rotation.  Retractors were placed along the medial calcar and posteriorly over the greater trochanter.  Once the proximal femur was fully   exposed, I used a box osteotome to set orientation.  I then began   broaching with the starting chili pepper broach and passed this by hand and then broached up to 5.  With the 5 broach in place I chose a standard offset neck and did several trial reductions.  The offset was appropriate, leg lengths   appeared to be equal best matched with the +5 head ball trial confirmed radiographically.   Given these findings, I went ahead and dislocated the hip, repositioned all   retractors and positioned the right hip in the extended and abducted position.  The final 6 standard Actis stem was   chosen and it was impacted down to the level of neck cut.  Based on this   and the trial reductions, a final 36+5 delta ceramic ball was chosen and   impacted onto a clean and dry trunnion, and the hip was reduced.  The   hip had been irrigated throughout the case again at this point.  I did   reapproximate the superior capsular leaflet to the anterior leaflet   using #1 Vicryl.  The fascia of the   tensor fascia lata muscle was then reapproximated using #1 Vicryl and #0 Stratafix sutures.  The   remaining wound was closed with 2-0 Vicryl and running  4-0 Monocryl.   The hip was cleaned, dried, and dressed sterilely using Dermabond and   Aquacel dressing.  The patient was then brought   to  recovery room in stable condition tolerating the procedure well.    Costella Hatcher, PA-C was present for the entirety of the case involved from   preoperative positioning, perioperative retractor management, general   facilitation of the case, as well as primary wound closure as assistant.            Pietro Cassis Alvan Dame, M.D.        12/10/2021 11:55 AM

## 2021-12-10 NOTE — Anesthesia Procedure Notes (Signed)
Spinal  Patient location during procedure: OR Start time: 12/10/2021 2:17 PM End time: 12/10/2021 2:23 PM Reason for block: surgical anesthesia Staffing Performed: anesthesiologist  Anesthesiologist: Myrtie Soman, MD Preanesthetic Checklist Completed: patient identified, IV checked, site marked, risks and benefits discussed, surgical consent, monitors and equipment checked, pre-op evaluation and timeout performed Spinal Block Patient position: sitting Prep: Betadine Patient monitoring: heart rate, continuous pulse ox and blood pressure Approach: midline Location: L3-4 Injection technique: single-shot Needle Needle type: Sprotte  Needle gauge: 24 G Needle length: 9 cm Assessment Sensory level: T6 Events: CSF return and second provider Additional Notes

## 2021-12-10 NOTE — Anesthesia Postprocedure Evaluation (Signed)
Anesthesia Post Note  Patient: Sarah James  Procedure(s) Performed: TOTAL HIP ARTHROPLASTY ANTERIOR APPROACH (Right: Hip)     Patient location during evaluation: PACU Anesthesia Type: Spinal Level of consciousness: oriented and awake and alert Pain management: pain level controlled Vital Signs Assessment: post-procedure vital signs reviewed and stable Respiratory status: spontaneous breathing, respiratory function stable and patient connected to nasal cannula oxygen Cardiovascular status: blood pressure returned to baseline and stable Postop Assessment: no headache, no backache and no apparent nausea or vomiting Anesthetic complications: no   No notable events documented.  Last Vitals:  Vitals:   12/10/21 1800 12/10/21 1815  BP: 109/69 108/72  Pulse: 61 62  Resp: 14 12  Temp: 36.5 C   SpO2: 94% 100%    Last Pain:  Vitals:   12/10/21 1815  TempSrc:   PainSc: 0-No pain                 Serenna Deroy S

## 2021-12-10 NOTE — Discharge Instructions (Signed)

## 2021-12-10 NOTE — Interval H&P Note (Signed)
History and Physical Interval Note:  12/10/2021 11:47 AM  Sarah James  has presented today for surgery, with the diagnosis of Right hip osteoarthritis.  The various methods of treatment have been discussed with the patient and family. After consideration of risks, benefits and other options for treatment, the patient has consented to  Procedure(s): TOTAL HIP ARTHROPLASTY ANTERIOR APPROACH (Right) as a surgical intervention.  The patient's history has been reviewed, patient examined, no change in status, stable for surgery.  I have reviewed the patient's chart and labs.  Questions were answered to the patient's satisfaction.     Mauri Pole

## 2021-12-10 NOTE — Anesthesia Preprocedure Evaluation (Signed)
Anesthesia Evaluation  Patient identified by MRN, date of birth, ID band Patient awake    Reviewed: Allergy & Precautions, NPO status , Patient's Chart, lab work & pertinent test results  Airway Mallampati: II  TM Distance: >3 FB Neck ROM: Full    Dental no notable dental hx.    Pulmonary neg pulmonary ROS, former smoker,    Pulmonary exam normal breath sounds clear to auscultation       Cardiovascular negative cardio ROS Normal cardiovascular exam Rhythm:Regular Rate:Normal     Neuro/Psych negative neurological ROS  negative psych ROS   GI/Hepatic Neg liver ROS, GERD  Medicated,  Endo/Other  Hypothyroidism   Renal/GU negative Renal ROS  negative genitourinary   Musculoskeletal negative musculoskeletal ROS (+)   Abdominal   Peds negative pediatric ROS (+)  Hematology negative hematology ROS (+)   Anesthesia Other Findings   Reproductive/Obstetrics negative OB ROS                             Anesthesia Physical Anesthesia Plan  ASA: 2  Anesthesia Plan: Spinal   Post-op Pain Management:    Induction: Intravenous  PONV Risk Score and Plan: 2 and Propofol infusion, Treatment may vary due to age or medical condition and Ondansetron  Airway Management Planned: Simple Face Mask  Additional Equipment:   Intra-op Plan:   Post-operative Plan:   Informed Consent: I have reviewed the patients History and Physical, chart, labs and discussed the procedure including the risks, benefits and alternatives for the proposed anesthesia with the patient or authorized representative who has indicated his/her understanding and acceptance.     Dental advisory given  Plan Discussed with: CRNA and Surgeon  Anesthesia Plan Comments:         Anesthesia Quick Evaluation

## 2021-12-10 NOTE — Transfer of Care (Signed)
Immediate Anesthesia Transfer of Care Note  Patient: Sarah James  Procedure(s) Performed: TOTAL HIP ARTHROPLASTY ANTERIOR APPROACH (Right: Hip)  Patient Location: PACU  Anesthesia Type:Spinal  Level of Consciousness: awake, alert  and oriented  Airway & Oxygen Therapy: Patient Spontanous Breathing and Patient connected to face mask oxygen  Post-op Assessment: Report given to RN and Post -op Vital signs reviewed and stable  Post vital signs: Reviewed and stable  Last Vitals:  Vitals Value Taken Time  BP 81/56 12/10/21 1601  Temp    Pulse 74 12/10/21 1602  Resp 12 12/10/21 1602  SpO2 99 % 12/10/21 1602  Vitals shown include unvalidated device data.  Last Pain:  Vitals:   12/10/21 1207  TempSrc: Oral  PainSc:          Complications: No notable events documented.

## 2021-12-11 ENCOUNTER — Encounter (HOSPITAL_COMMUNITY): Payer: Self-pay | Admitting: Orthopedic Surgery

## 2021-12-11 DIAGNOSIS — M1611 Unilateral primary osteoarthritis, right hip: Secondary | ICD-10-CM | POA: Diagnosis not present

## 2021-12-11 LAB — CBC
HCT: 30.1 % — ABNORMAL LOW (ref 36.0–46.0)
Hemoglobin: 9.8 g/dL — ABNORMAL LOW (ref 12.0–15.0)
MCH: 29.9 pg (ref 26.0–34.0)
MCHC: 32.6 g/dL (ref 30.0–36.0)
MCV: 91.8 fL (ref 80.0–100.0)
Platelets: 253 10*3/uL (ref 150–400)
RBC: 3.28 MIL/uL — ABNORMAL LOW (ref 3.87–5.11)
RDW: 13.6 % (ref 11.5–15.5)
WBC: 11 10*3/uL — ABNORMAL HIGH (ref 4.0–10.5)
nRBC: 0 % (ref 0.0–0.2)

## 2021-12-11 LAB — BASIC METABOLIC PANEL
Anion gap: 6 (ref 5–15)
BUN: 16 mg/dL (ref 8–23)
CO2: 26 mmol/L (ref 22–32)
Calcium: 8.7 mg/dL — ABNORMAL LOW (ref 8.9–10.3)
Chloride: 104 mmol/L (ref 98–111)
Creatinine, Ser: 0.74 mg/dL (ref 0.44–1.00)
GFR, Estimated: >60 mL/min (ref >60)
Glucose, Bld: 218 mg/dL — ABNORMAL HIGH (ref 70–99)
Potassium: 4.4 mmol/L (ref 3.5–5.1)
Sodium: 136 mmol/L (ref 135–145)

## 2021-12-11 MED ORDER — LEVOTHYROXINE SODIUM 88 MCG PO TABS
88.0000 ug | ORAL_TABLET | Freq: Every day | ORAL | Status: DC
  Administered 2021-12-11: 88 ug via ORAL
  Filled 2021-12-11: qty 1

## 2021-12-11 MED ORDER — DESVENLAFAXINE SUCCINATE ER 25 MG PO TB24: 25.0000 mg | ORAL_TABLET | Freq: Every day | ORAL | Status: DC

## 2021-12-11 MED ORDER — HYDROCODONE-ACETAMINOPHEN 5-325 MG PO TABS: 1.0000 | ORAL_TABLET | Freq: Four times a day (QID) | ORAL | 0 refills | Status: DC | PRN

## 2021-12-11 MED ORDER — CELECOXIB 200 MG PO CAPS: 200.0000 mg | ORAL_CAPSULE | Freq: Two times a day (BID) | ORAL | 0 refills | Status: DC

## 2021-12-11 MED ORDER — SODIUM CHLORIDE 0.9 % IV BOLUS: 500.0000 mL | Freq: Once | INTRAVENOUS | Status: DC

## 2021-12-11 MED ORDER — DOCUSATE SODIUM 100 MG PO CAPS: 100.0000 mg | ORAL_CAPSULE | Freq: Two times a day (BID) | ORAL | 0 refills | Status: DC

## 2021-12-11 MED ORDER — SODIUM CHLORIDE 0.9 % IV BOLUS
250.0000 mL | Freq: Once | INTRAVENOUS | Status: AC
  Administered 2021-12-11: 250 mL via INTRAVENOUS

## 2021-12-11 MED ORDER — AMPHETAMINE-DEXTROAMPHET ER 10 MG PO CP24
30.0000 mg | ORAL_CAPSULE | Freq: Every day | ORAL | Status: DC
  Administered 2021-12-11: 30 mg via ORAL
  Filled 2021-12-11: qty 3

## 2021-12-11 MED ORDER — VENLAFAXINE HCL ER 37.5 MG PO CP24
37.5000 mg | ORAL_CAPSULE | Freq: Every day | ORAL | Status: DC
  Administered 2021-12-11: 37.5 mg via ORAL
  Filled 2021-12-11: qty 1

## 2021-12-11 MED ORDER — BUPROPION HCL ER (XL) 150 MG PO TB24
150.0000 mg | ORAL_TABLET | Freq: Every day | ORAL | Status: DC
  Administered 2021-12-11: 150 mg via ORAL
  Filled 2021-12-11: qty 1

## 2021-12-11 MED ORDER — METHOCARBAMOL 500 MG PO TABS: 500.0000 mg | ORAL_TABLET | Freq: Four times a day (QID) | ORAL | 0 refills | Status: DC | PRN

## 2021-12-11 MED ORDER — ASPIRIN 81 MG PO CHEW: 81.0000 mg | CHEWABLE_TABLET | Freq: Two times a day (BID) | ORAL | 0 refills | Status: AC

## 2021-12-11 NOTE — Plan of Care (Signed)
°  Problem: Pain Managment: Goal: General experience of comfort will improve Outcome: Progressing   Problem: Elimination: Goal: Will not experience complications related to bowel motility Outcome: Progressing   Problem: Elimination: Goal: Will not experience complications related to urinary retention Outcome: Progressing   Problem: Coping: Goal: Level of anxiety will decrease Outcome: Progressing

## 2021-12-11 NOTE — Progress Notes (Signed)
Physical Therapy Treatment Patient Details Name: Sarah James MRN: [id number] DOB: [birth year] Today's Date: 12/11/2021   History of Present Illness Pt is a 71 year old female s/p Rt THA direct anterior approach on 12/10/21.  PMHx: R TKA, L THA    PT Comments    Pt ambulated in hallway again and practiced safe stair technique.  Pt also performed standing exercises and provided with HEP handout.  Pt reports understanding and feels ready for d/c home today.   Recommendations for follow up therapy are one component of a multi-disciplinary discharge planning process, led by the attending physician.  Recommendations may be updated based on patient status, additional functional criteria and insurance authorization.  Follow Up Recommendations  Follow physician's recommendations for discharge plan and follow up therapies     Assistance Recommended at Discharge PRN  Patient can return home with the following     Equipment Recommendations  None recommended by PT    Recommendations for Other Services       Precautions / Restrictions Precautions Precautions: Fall Restrictions Other Position/Activity Restrictions: WBAT     Mobility  Bed Mobility Overal bed mobility: Modified Independent             General bed mobility comments: sitting EOB on arrival    Transfers Overall transfer level: Needs assistance Equipment used: Rolling walker (2 wheels) Transfers: Sit to/from Stand Sit to Stand: Min guard;Supervision           General transfer comment: verbal cues for hand placement    Ambulation/Gait Ambulation/Gait assistance: Min guard;Supervision Gait Distance (Feet): 380 Feet Assistive device: Rolling walker (2 wheels) Gait Pattern/deviations: Step-through pattern;Antalgic;Decreased stride length;Decreased stance time - right       General Gait Details: verbal cues for RW positioning, posture, step length   Stairs Stairs: Yes Stairs assistance: Min guard Stair  Management: Step to pattern;Forwards;Two rails Number of Stairs: 2 General stair comments: verbal cues for sequence and safety; pt performed twice and reports understanding   Wheelchair Mobility    Modified Rankin (Stroke Patients Only)       Balance                                            Cognition Arousal/Alertness: Awake/alert Behavior During Therapy: WFL for tasks assessed/performed Overall Cognitive Status: Within Functional Limits for tasks assessed                                          Exercises Total Joint Exercises Hip ABduction/ADduction: Right;10 reps;AROM;Standing Long Arc Quad: AROM;Right;Seated;10 reps Knee Flexion: AROM;Right;10 reps;Standing Marching in Standing: AROM;Right;10 reps;Standing Standing Hip Extension: AROM;Right;Standing;10 reps    General Comments        Pertinent Vitals/Pain Pain Assessment: 0-10 Pain Score: 3  Pain Location: right hip Pain Descriptors / Indicators: Aching;Sore Pain Intervention(s): Monitored during session;Repositioned    Home Living Family/patient expects to be discharged to:: Private residence Living Arrangements: Spouse/significant other   Type of Home: House Home Access: Stairs to enter Entrance Stairs-Rails: Right Entrance Stairs-Number of Steps: 3   Home Layout: Two level;Able to live on main level with bedroom/bathroom Home Equipment: Rolling Walker (2 wheels);BSC/3in1      Prior Function            PT  Goals (current goals can now be found in the care plan section) Acute Rehab PT Goals PT Goal Formulation: With patient Time For Goal Achievement: 12/18/21 Potential to Achieve Goals: Good Progress towards PT goals: Progressing toward goals    Frequency    7X/week      PT Plan Current plan remains appropriate    Co-evaluation              AM-PAC PT "6 Clicks" Mobility   Outcome Measure  Help needed turning from your back to your side  while in a flat bed without using bedrails?: A Little Help needed moving from lying on your back to sitting on the side of a flat bed without using bedrails?: A Little Help needed moving to and from a bed to a chair (including a wheelchair)?: A Little Help needed standing up from a chair using your arms (e.g., wheelchair or bedside chair)?: A Little Help needed to walk in hospital room?: A Little Help needed climbing 3-5 steps with a railing? : A Little 6 Click Score: 18    End of Session Equipment Utilized During Treatment: Gait belt Activity Tolerance: Patient tolerated treatment well Patient left: in bed;with call bell/phone within reach Nurse Communication: Mobility status PT Visit Diagnosis: Other abnormalities of gait and mobility (R26.89)     Time: 9784-7841 PT Time Calculation (min) (ACUTE ONLY): 14 min  Charges:  $Gait Training: 8-22 mins                     Arlyce Dice, DPT Acute Rehabilitation Services Pager: [phone] Office: [place] 12/11/2021, 3:30 PM

## 2021-12-11 NOTE — TOC Transition Note (Signed)
Transition of Care [facility]) - CM/SW Discharge Note  Patient Details  Name: Sarah James MRN: [id number] Date of Birth: 05-14-1951  [facility] CM/SW Contact:  Sherie Don, LCSW Phone Number: 12/11/2021, 12:18 PM  Clinical Narrative: Patient is expected to discharge home after working with PT. CSW met with patient to confirm discharge plan. Patient will go home with a home exercise program (HEP). Patient has a rolling walker and 3N1 at home, so there are no DME needs at this time. TOC signing off.    Final next level of care: Home/Self Care Barriers to Discharge: No Barriers Identified  Patient Goals and CMS Choice Patient states their goals for this hospitalization and ongoing recovery are:: Discharge home with HEP Choice offered to / list presented to : NA  Discharge Plan and Services         DME Arranged: N/A DME Agency: NA  Readmission Risk Interventions No flowsheet data found.

## 2021-12-11 NOTE — Evaluation (Signed)
Physical Therapy Evaluation Patient Details Name: Sarah James MRN: [id number] DOB: 07-09-1951 Today's Date: 12/11/2021  History of Present Illness  Pt is a 71 year old female s/p Rt THA direct anterior approach on 12/10/21.  PMHx: R TKA, L THA  Clinical Impression  Pt is s/p THA resulting in the deficits listed below (see PT Problem List). Pt will benefit from skilled PT to increase their independence and safety with mobility to allow discharge to the venue listed below.  Pt reports feeling better after bolus.  Pt able to ambulate in hallway and perform LE exercises.  Pt likely to d/c home later today after practicing steps.      Recommendations for follow up therapy are one component of a multi-disciplinary discharge planning process, led by the attending physician.  Recommendations may be updated based on patient status, additional functional criteria and insurance authorization.  Follow Up Recommendations Follow physician's recommendations for discharge plan and follow up therapies    Assistance Recommended at Discharge    Patient can return home with the following       Equipment Recommendations None recommended by PT  Recommendations for Other Services       Functional Status Assessment Patient has had a recent decline in their functional status and demonstrates the ability to make significant improvements in function in a reasonable and predictable amount of time.     Precautions / Restrictions Precautions Precautions: Fall Restrictions Other Position/Activity Restrictions: WBAT      Mobility  Bed Mobility               General bed mobility comments: sitting EOB on arrival    Transfers Overall transfer level: Needs assistance Equipment used: Rolling walker (2 wheels) Transfers: Sit to/from Stand Sit to Stand: Min guard           General transfer comment: verbal cues for hand placement    Ambulation/Gait Ambulation/Gait assistance: Min guard Gait  Distance (Feet): 380 Feet Assistive device: Rolling walker (2 wheels) Gait Pattern/deviations: Step-to pattern;Step-through pattern;Antalgic;Decreased stride length;Decreased stance time - right       General Gait Details: verbal cues for RW positioning, posture, step length  Stairs            Wheelchair Mobility    Modified Rankin (Stroke Patients Only)       Balance                                             Pertinent Vitals/Pain Pain Assessment: 0-10 Pain Score: 2  Pain Location: right hip Pain Descriptors / Indicators: Aching;Sore Pain Intervention(s): Repositioned;Monitored during session    Home Living Family/patient expects to be discharged to:: Private residence Living Arrangements: Spouse/significant other   Type of Home: House Home Access: Stairs to enter Entrance Stairs-Rails: Right Entrance Stairs-Number of Steps: 3   Home Layout: Two level;Able to live on main level with bedroom/bathroom Home Equipment: Conservation officer, nature (2 wheels);BSC/3in1      Prior Function Prior Level of Function : Independent/Modified Independent                     Hand Dominance        Extremity/Trunk Assessment        Lower Extremity Assessment Lower Extremity Assessment: RLE deficits/detail RLE Deficits / Details: anticipated post op hip weakness, observed grossly 2+/5 hip strength, able to  perform ankle pumps       Communication   Communication: No difficulties  Cognition Arousal/Alertness: Awake/alert Behavior During Therapy: WFL for tasks assessed/performed Overall Cognitive Status: Within Functional Limits for tasks assessed                                          General Comments      Exercises Total Joint Exercises Ankle Circles/Pumps: AROM;Both;10 reps Quad Sets: AROM;10 reps;Both Heel Slides: 10 reps;AAROM;Right Hip ABduction/ADduction: AAROM;Right;10 reps Long Arc Quad: AROM;Right;Seated;10 reps    Assessment/Plan    PT Assessment Patient needs continued PT services  PT Problem List Decreased strength;Decreased activity tolerance;Decreased mobility;Decreased knowledge of use of DME;Pain       PT Treatment Interventions DME instruction;Gait training;Stair training;Therapeutic activities;Functional mobility training;Patient/family education;Balance training;Therapeutic exercise    PT Goals (Current goals can be found in the Care Plan section)  Acute Rehab PT Goals PT Goal Formulation: With patient Time For Goal Achievement: 12/18/21 Potential to Achieve Goals: Good    Frequency 7X/week     Co-evaluation               AM-PAC PT "6 Clicks" Mobility  Outcome Measure Help needed turning from your back to your side while in a flat bed without using bedrails?: A Little Help needed moving from lying on your back to sitting on the side of a flat bed without using bedrails?: A Little Help needed moving to and from a bed to a chair (including a wheelchair)?: A Little Help needed standing up from a chair using your arms (e.g., wheelchair or bedside chair)?: A Little Help needed to walk in hospital room?: A Little Help needed climbing 3-5 steps with a railing? : A Little 6 Click Score: 18    End of Session Equipment Utilized During Treatment: Gait belt Activity Tolerance: Patient tolerated treatment well Patient left: in chair;with call bell/phone within reach;with family/visitor present   PT Visit Diagnosis: Other abnormalities of gait and mobility (R26.89)    Time: 1203-1221 PT Time Calculation (min) (ACUTE ONLY): 18 min   Charges:   PT Evaluation $PT Eval Low Complexity: 1 Low        Kati PT, DPT Acute Rehabilitation Services Pager: [phone] Office: [place] 12/11/2021, 2:02 PM

## 2021-12-11 NOTE — Plan of Care (Signed)

## 2021-12-11 NOTE — Progress Notes (Signed)
° °  Subjective: 1 Day Post-Op Procedure(s) (LRB): TOTAL HIP ARTHROPLASTY ANTERIOR APPROACH (Right) Patient reports pain as mild.   Patient seen in rounds for Dr. Alvan Dame. Patient is resting in bed on exam this morning. Foley catheter removed. Denies CP, SHOB. She reports that she waited in PACU for a while prior to getting a bed yesterday, but has otherwise been very happy with her stay so far.  We will start therapy today.   Objective: Vital signs in last 24 hours: Temp:  [97.7 F (36.5 C)-98.4 F (36.9 C)] 98.2 F (36.8 C) (01/04 0907) Pulse Rate:  [60-92] 66 (01/04 0907) Resp:  [11-18] 16 (01/04 0907) BP: (81-134)/(53-80) 83/55 (01/04 0907) SpO2:  [94 %-100 %] 94 % (01/04 0907) FiO2 (%):  [28 %] 28 % (01/03 2212) Weight:  [81.2 kg] 81.2 kg (01/03 1159)  Intake/Output from previous day:  Intake/Output Summary (Last 24 hours) at 12/11/2021 0915 Last data filed at 12/11/2021 0900 Gross per 24 hour  Intake 4201.67 ml  Output 2375 ml  Net 1826.67 ml     Intake/Output this shift: Total I/O In: 490 [P.O.:240; IV Piggyback:250] Out: -   Labs: Recent Labs    12/11/21 0425  HGB 9.8*   Recent Labs    12/11/21 0425  WBC 11.0*  RBC 3.28*  HCT 30.1*  PLT 253   Recent Labs    12/11/21 0425  NA 136  K 4.4  CL 104  CO2 26  BUN 16  CREATININE 0.74  GLUCOSE 218*  CALCIUM 8.7*   No results for input(s): LABPT, INR in the last 72 hours.  Exam: General - Patient is Alert and Oriented Extremity - Neurologically intact Sensation intact distally Intact pulses distally Dorsiflexion/Plantar flexion intact Dressing - dressing C/D/I Motor Function - intact, moving foot and toes well on exam.   Past Medical History:  Diagnosis Date   ADD (attention deficit disorder)    Anemia    hx of    Anxiety    Arthritis    Depression    GERD (gastroesophageal reflux disease)    Heartburn    Hypothyroidism     Assessment/Plan: 1 Day Post-Op Procedure(s) (LRB): TOTAL HIP  ARTHROPLASTY ANTERIOR APPROACH (Right) Principal Problem:   S/P total right hip arthroplasty  Estimated body mass index is 29.79 kg/m as calculated from the following:   Height as of 11/27/21: 5\' 5"  (1.651 m).   Weight as of this encounter: 81.2 kg. Advance diet Up with therapy D/C IV fluids  DVT Prophylaxis - Aspirin Weight bearing as tolerated.  BP soft this AM at 83/55. Hgb stable at 9.8. Will give a 250 cc bolus to start with. Encouraged PO fluids.   Plan is to go Home after hospital stay. Plan to get up with PT today. She may discharge home after 1-2 sessions if she is meeting her goals and otherwise stable. Follow up in the office in 2 weeks.   Griffith Citron, PA-C Orthopedic Surgery [phone] 12/11/2021, 9:15 AM

## 2021-12-19 NOTE — Discharge Summary (Signed)
Patient ID: TZIVIA ONEIL MRN: [id number] DOB/AGE: [birth year] 71 y.o.  Admit date: 12/10/2021 Discharge date: 12/11/2021  Admission Diagnoses:  Right hip osteoarthritis  Discharge Diagnoses:  Principal Problem:   S/P total right hip arthroplasty   Past Medical History:  Diagnosis Date   ADD (attention deficit disorder)    Anemia    hx of    Anxiety    Arthritis    Depression    GERD (gastroesophageal reflux disease)    Heartburn    Hypothyroidism     Surgeries: Procedure(s): TOTAL HIP ARTHROPLASTY ANTERIOR APPROACH on 12/10/2021   Consultants:   Discharged Condition: Improved  Hospital Course: NIL BOLSER is an 71 y.o. female who was admitted 12/10/2021 for operative treatment ofS/P total right hip arthroplasty. Patient has severe unremitting pain that affects sleep, daily activities, and work/hobbies. After pre-op clearance the patient was taken to the operating room on 12/10/2021 and underwent  Procedure(s): TOTAL HIP ARTHROPLASTY ANTERIOR APPROACH.    Patient was given perioperative antibiotics:  Anti-infectives (From admission, onward)    Start     Dose/Rate Route Frequency Ordered Stop   12/10/21 2000  ceFAZolin (ANCEF) IVPB 2g/100 mL premix        2 g 200 mL/hr over 30 Minutes Intravenous Every 6 hours 12/10/21 1557 12/11/21 0216   12/10/21 1145  ceFAZolin (ANCEF) IVPB 2g/100 mL premix        2 g 200 mL/hr over 30 Minutes Intravenous On call to O.R. 12/10/21 1136 12/10/21 1427        Patient was given sequential compression devices, early ambulation, and chemoprophylaxis to prevent DVT. Patient worked with PT and was meeting their goals regarding safe ambulation and transfers.  Patient benefited maximally from hospital stay and there were no complications.    Recent vital signs: No data found.   Recent laboratory studies: No results for input(s): WBC, HGB, HCT, PLT, NA, K, CL, CO2, BUN, CREATININE, GLUCOSE, INR, CALCIUM in the last 72 hours.  Invalid  input(s): PT, 2   Discharge Medications:   Allergies as of 12/11/2021   Not on File      Medication List     TAKE these medications    amphetamine-dextroamphetamine 30 MG 24 hr capsule Commonly known as: ADDERALL XR Take 30 mg by mouth daily.   aspirin 81 MG chewable tablet Chew 1 tablet (81 mg total) by mouth 2 (two) times daily for 28 days.   b complex vitamins capsule Take 1 capsule by mouth daily.   buPROPion 150 MG 24 hr tablet Commonly known as: WELLBUTRIN XL Take 1 tablet (150 mg total) by mouth daily.   calcium carbonate 600 MG Tabs tablet Commonly known as: OS-CAL Take 600 mg by mouth daily with breakfast.   celecoxib 200 MG capsule Commonly known as: CELEBREX Take 1 capsule (200 mg total) by mouth 2 (two) times daily.   desvenlafaxine 25 MG 24 hr tablet Commonly known as: PRISTIQ Take 25 mg by mouth daily.   docusate sodium 100 MG capsule Commonly known as: COLACE Take 1 capsule (100 mg total) by mouth 2 (two) times daily.   Fluzone High-Dose Quadrivalent 0.7 ML Susy Generic drug: Influenza Vac High-Dose Quad Fluzone High-Dose Quad 2020-21 (PF) 240 mcg/0.7 mL IM syringe   Havrix 1440 EL U/ML injection Generic drug: hepatitis A virus (PF) vaccine Havrix (PF) 1,440 ELISA unit/mL intramuscular syringe   HYDROcodone-acetaminophen 5-325 MG tablet Commonly known as: NORCO/VICODIN Take 1-2 tablets by mouth every 6 (six) hours as  needed for severe pain.   Immune Enhance Tabs Take 2 tablets by mouth daily.   levothyroxine 88 MCG tablet Commonly known as: SYNTHROID Take 88 mcg by mouth daily before breakfast.   LUBRICATING EYE DROPS OP Apply 1 drop to eye at bedtime as needed (dry eyes).   MAGNESIUM PO Take 1 tablet by mouth daily.   methocarbamol 500 MG tablet Commonly known as: ROBAXIN Take 1 tablet (500 mg total) by mouth every 6 (six) hours as needed for muscle spasms.   multivitamin capsule Take 1 capsule by mouth daily.   omeprazole 20  MG tablet Commonly known as: PRILOSEC OTC Take 20 mg by mouth daily as needed (acid reflux).   Pneumovax 23 25 MCG/0.5ML injection Generic drug: pneumococcal 23 valent vaccine Pneumovax-23 25 mcg/0.5 mL injection syringe   polyethylene glycol 17 g packet Commonly known as: MIRALAX / GLYCOLAX Take 17 g 2 (two) times daily by mouth. What changed:  when to take this reasons to take this   PROBIOTIC-10 ULTIMATE PO Take 1 tablet by mouth daily.   traZODone 100 MG tablet Commonly known as: DESYREL Take 50 mg by mouth at bedtime as needed for sleep.   Turmeric 500 MG Caps Take 500 mg by mouth daily.   vitamin C 1000 MG tablet Take 1,000 mg by mouth daily.   VITAMIN D PO Take 2,000 Units by mouth daily.   zinc gluconate 50 MG tablet Take 50 mg by mouth daily.               Discharge Care Instructions  (From admission, onward)           Start     Ordered   12/11/21 0000  Change dressing       Comments: Maintain surgical dressing until follow up in the clinic. If the edges start to pull up, may reinforce with tape. If the dressing is no longer working, may remove and cover with gauze and tape, but must keep the area dry and clean.  Call with any questions or concerns.   12/11/21 1237            Diagnostic Studies: DG Pelvis Portable  Result Date: 12/10/2021 CLINICAL DATA:  Right hip arthroplasty EXAM: PORTABLE PELVIS 1-2 VIEWS COMPARISON:  None. FINDINGS: There is recent right hip arthroplasty. There is previous left hip arthroplasty. There are pockets of air in the soft tissues adjacent to the right hip. IMPRESSION: Status post right hip arthroplasty. Electronically Signed   By: Elmer Picker M.D.   On: 12/10/2021 16:49   DG C-Arm 1-60 Min-No Report  Result Date: 12/10/2021 Fluoroscopy was utilized by the requesting physician.  No radiographic interpretation.   DG C-Arm 1-60 Min-No Report  Result Date: 12/10/2021 Fluoroscopy was utilized by the  requesting physician.  No radiographic interpretation.   DG HIP UNILAT WITH PELVIS 1V RIGHT  Result Date: 12/10/2021 CLINICAL DATA:  Hip replacement surgery EXAM: DG HIP (WITH OR WITHOUT PELVIS) 1V RIGHT COMPARISON:  09/30/2016 FINDINGS: Series of fluoroscopic spot images document placement of right hip arthroplasty components. Final image shows good position, no fracture or dislocation. Extant left hip arthroplasty components incidentally noted. IMPRESSION: Interval right hip arthroplasty, without apparent complication. Electronically Signed   By: Lucrezia Europe M.D.   On: 12/10/2021 15:52    Disposition: Discharge disposition: 01-Home or Self Care       Discharge Instructions     Call MD / Call 911   Complete by: As directed  If you experience chest pain or shortness of breath, CALL 911 and be transported to the hospital emergency room.  If you develope a fever above 101 F, pus (white drainage) or increased drainage or redness at the wound, or calf pain, call your surgeon's office.   Change dressing   Complete by: As directed    Maintain surgical dressing until follow up in the clinic. If the edges start to pull up, may reinforce with tape. If the dressing is no longer working, may remove and cover with gauze and tape, but must keep the area dry and clean.  Call with any questions or concerns.   Constipation Prevention   Complete by: As directed    Drink plenty of fluids.  Prune juice may be helpful.  You may use a stool softener, such as Colace (over the counter) 100 mg twice a day.  Use MiraLax (over the counter) for constipation as needed.   Diet - low sodium heart healthy   Complete by: As directed    Increase activity slowly as tolerated   Complete by: As directed    Weight bearing as tolerated with assist device (walker, cane, etc) as directed, use it as long as suggested by your surgeon or therapist, typically at least 4-6 weeks.   Post-operative opioid taper instructions:    Complete by: As directed    POST-OPERATIVE OPIOID TAPER INSTRUCTIONS: It is important to wean off of your opioid medication as soon as possible. If you do not need pain medication after your surgery it is ok to stop day one. Opioids include: Codeine, Hydrocodone(Norco, Vicodin), Oxycodone(Percocet, oxycontin) and hydromorphone amongst others.  Long term and even short term use of opiods can cause: Increased pain response Dependence Constipation Depression Respiratory depression And more.  Withdrawal symptoms can include Flu like symptoms Nausea, vomiting And more Techniques to manage these symptoms Hydrate well Eat regular healthy meals Stay active Use relaxation techniques(deep breathing, meditating, yoga) Do Not substitute Alcohol to help with tapering If you have been on opioids for less than two weeks and do not have pain than it is ok to stop all together.  Plan to wean off of opioids This plan should start within one week post op of your joint replacement. Maintain the same interval or time between taking each dose and first decrease the dose.  Cut the total daily intake of opioids by one tablet each day Next start to increase the time between doses. The last dose that should be eliminated is the evening dose.      TED hose   Complete by: As directed    Use stockings (TED hose) for 2 weeks on both leg(s).  You may remove them at night for sleeping.        Follow-up Information     Paralee Cancel, MD. Go on 12/25/2021.   Specialty: Orthopedic Surgery Why: You are scheduled for first post op appointment on Wednesday January 18th at 9:30am. Contact information: [address] [place] [address] [phone]                  Signed: Irving Copas 12/19/2021, 2:16 PM

## 2021-12-24 ENCOUNTER — Ambulatory Visit (HOSPITAL_BASED_OUTPATIENT_CLINIC_OR_DEPARTMENT_OTHER): Payer: PPO | Admitting: Obstetrics & Gynecology

## 2021-12-25 ENCOUNTER — Ambulatory Visit (HOSPITAL_BASED_OUTPATIENT_CLINIC_OR_DEPARTMENT_OTHER): Payer: PPO | Admitting: Obstetrics & Gynecology

## 2021-12-25 DIAGNOSIS — M1611 Unilateral primary osteoarthritis, right hip: Secondary | ICD-10-CM | POA: Diagnosis not present

## 2021-12-31 DIAGNOSIS — Z79899 Other long term (current) drug therapy: Secondary | ICD-10-CM | POA: Diagnosis not present

## 2021-12-31 DIAGNOSIS — G4700 Insomnia, unspecified: Secondary | ICD-10-CM | POA: Diagnosis not present

## 2021-12-31 DIAGNOSIS — F321 Major depressive disorder, single episode, moderate: Secondary | ICD-10-CM | POA: Diagnosis not present

## 2021-12-31 DIAGNOSIS — Z0001 Encounter for general adult medical examination with abnormal findings: Secondary | ICD-10-CM | POA: Diagnosis not present

## 2021-12-31 DIAGNOSIS — F900 Attention-deficit hyperactivity disorder, predominantly inattentive type: Secondary | ICD-10-CM | POA: Diagnosis not present

## 2021-12-31 DIAGNOSIS — E039 Hypothyroidism, unspecified: Secondary | ICD-10-CM | POA: Diagnosis not present

## 2021-12-31 DIAGNOSIS — E2839 Other primary ovarian failure: Secondary | ICD-10-CM | POA: Diagnosis not present

## 2022-01-22 DIAGNOSIS — Z4789 Encounter for other orthopedic aftercare: Secondary | ICD-10-CM | POA: Diagnosis not present

## 2022-01-22 DIAGNOSIS — Z96641 Presence of right artificial hip joint: Secondary | ICD-10-CM | POA: Diagnosis not present

## 2022-01-22 DIAGNOSIS — Z471 Aftercare following joint replacement surgery: Secondary | ICD-10-CM | POA: Diagnosis not present

## 2022-01-28 ENCOUNTER — Other Ambulatory Visit: Payer: Self-pay

## 2022-01-28 ENCOUNTER — Encounter (HOSPITAL_BASED_OUTPATIENT_CLINIC_OR_DEPARTMENT_OTHER): Payer: Self-pay | Admitting: Obstetrics & Gynecology

## 2022-01-28 ENCOUNTER — Ambulatory Visit (HOSPITAL_BASED_OUTPATIENT_CLINIC_OR_DEPARTMENT_OTHER): Payer: PPO | Admitting: Obstetrics & Gynecology

## 2022-01-28 ENCOUNTER — Other Ambulatory Visit (HOSPITAL_COMMUNITY)
Admission: RE | Admit: 2022-01-28 | Discharge: 2022-01-28 | Disposition: A | Discharge status: Home / Self Care | Payer: PPO | Source: Ambulatory Visit | Attending: Obstetrics & Gynecology | Admitting: Obstetrics & Gynecology

## 2022-01-28 VITALS — BP 116/70 | HR 95 | Ht 64.0 in | Wt 181.0 lb

## 2022-01-28 DIAGNOSIS — Z124 Encounter for screening for malignant neoplasm of cervix: Secondary | ICD-10-CM | POA: Insufficient documentation

## 2022-01-28 DIAGNOSIS — R3915 Urgency of urination: Secondary | ICD-10-CM | POA: Diagnosis not present

## 2022-01-28 DIAGNOSIS — N764 Abscess of vulva: Secondary | ICD-10-CM | POA: Diagnosis not present

## 2022-01-28 DIAGNOSIS — Z780 Asymptomatic menopausal state: Secondary | ICD-10-CM

## 2022-01-28 LAB — POCT URINALYSIS DIPSTICK
Bilirubin, UA: NEGATIVE
Blood, UA: NEGATIVE
Glucose, UA: NEGATIVE
Ketones, UA: NEGATIVE
Leukocytes, UA: NEGATIVE
Nitrite, UA: NEGATIVE
Protein, UA: NEGATIVE
Spec Grav, UA: >=1.03 — AB (ref 1.010–1.025)
Urobilinogen, UA: 0.2 E.U./dL
pH, UA: 5.5 (ref 5.0–8.0)

## 2022-01-28 MED ORDER — SULFAMETHOXAZOLE-TRIMETHOPRIM 800-160 MG PO TABS: 1.0000 | ORAL_TABLET | Freq: Two times a day (BID) | ORAL | 0 refills | Status: DC

## 2022-01-28 NOTE — Progress Notes (Signed)
71 y.o. G34P2004 Married White or Caucasian female here for new patient appointment.  PMP.  Was on HRT years ago.  Denies vaginal bleeding.  Is up to date on health maintenance.    Had recent hip replacement.  Shaved prior to surgery and is having some ingrown hairs on the vulvar region.  Would like me to look at this today.  Reports she got a lot of drainage out of it.  Still has some drainage today.  Denies fevers.  Reports some urinary urgency as well.  Desires urine tested.  Patient's last menstrual period was 12/08/2002.          Sexually active: Yes.    The current method of family planning is post menopausal status.    Exercising: typically plays tennis and pickleball Smoker:  no  Health Maintenance: Pap:  12/2018 History of abnormal Pap:  no MMG:  11/13/2021 Colonoscopy:  Dr. Benson Norway has done the last two BMD:   scheduled for May Screening Labs: Does with Dr. Dorthy Cooler   reports that she quit smoking about 31 years ago. Her smoking use included cigarettes. She has never used smokeless tobacco. She reports current alcohol use. She reports that she does not use drugs.  Past Medical History:  Diagnosis Date   ADD (attention deficit disorder)    Anemia    hx of    Anxiety    Arthritis    Depression    GERD (gastroesophageal reflux disease)    Heartburn    Hypothyroidism     Past Surgical History:  Procedure Laterality Date   deviated septum repair     KNEE ARTHROSCOPY Right 2011   left knee arthroscopy      TOTAL HIP ARTHROPLASTY Left 09/30/2016   Procedure: LEFT TOTAL HIP ARTHROPLASTY ANTERIOR APPROACH;  Surgeon: Paralee Cancel, MD;  Location: [facility];  Service: Orthopedics;  Laterality: Left;   TOTAL HIP ARTHROPLASTY Right 12/10/2021   Procedure: TOTAL HIP ARTHROPLASTY ANTERIOR APPROACH;  Surgeon: Paralee Cancel, MD;  Location: [facility];  Service: Orthopedics;  Laterality: Right;   TOTAL KNEE ARTHROPLASTY Right 10/20/2017   Procedure: RIGHT TOTAL KNEE ARTHROPLASTY;  Surgeon: Paralee Cancel, MD;  Location: [facility];  Service: Orthopedics;  Laterality: Right;   VARICOSE VEIN SURGERY      Current Outpatient Medications  Medication Sig Dispense Refill   amphetamine-dextroamphetamine (ADDERALL XR) 30 MG 24 hr capsule Take 30 mg by mouth daily.     Ascorbic Acid (VITAMIN C) 1000 MG tablet Take 1,000 mg by mouth daily.     b complex vitamins capsule Take 1 capsule by mouth daily.     buPROPion (WELLBUTRIN XL) 150 MG 24 hr tablet Take 1 tablet (150 mg total) by mouth daily. 90 tablet 0   calcium carbonate (OS-CAL) 600 MG TABS tablet Take 600 mg by mouth daily with breakfast.     Carboxymethylcellul-Glycerin (LUBRICATING EYE DROPS OP) Apply 1 drop to eye at bedtime as needed (dry eyes).     Cholecalciferol (VITAMIN D PO) Take 2,000 Units by mouth daily.     Desvenlafaxine Succinate ER 25 MG TB24 Take 25 mg by mouth daily.     hepatitis A virus, PF, vaccine (HAVRIX) 1440 EL U/ML injection Havrix (PF) 1,440 ELISA unit/mL intramuscular syringe     Influenza Vac High-Dose Quad (FLUZONE HIGH-DOSE QUADRIVALENT) 0.7 ML SUSY Fluzone High-Dose Quad 2020-21 (PF) 240 mcg/0.7 mL IM syringe     levothyroxine (SYNTHROID, LEVOTHROID) 88 MCG tablet Take 88 mcg by mouth daily before  breakfast.     MAGNESIUM PO Take 1 tablet by mouth daily.     Multiple Vitamin (MULTIVITAMIN) capsule Take 1 capsule by mouth daily.     Nutritional Supplements (IMMUNE ENHANCE) TABS Take 2 tablets by mouth daily.     omeprazole (PRILOSEC OTC) 20 MG tablet Take 20 mg by mouth daily as needed (acid reflux).     pneumococcal 23 valent vaccine (PNEUMOVAX 23) 25 MCG/0.5ML injection Pneumovax-23 25 mcg/0.5 mL injection syringe     polyethylene glycol (MIRALAX / GLYCOLAX) packet Take 17 g 2 (two) times daily by mouth. (Patient taking differently: Take 17 g by mouth daily as needed for mild constipation.) 14 each 0   Probiotic Product (PROBIOTIC-10 ULTIMATE PO) Take 1 tablet by mouth daily.     traZODone (DESYREL) 100 MG  tablet Take 50 mg by mouth at bedtime as needed for sleep.     Turmeric 500 MG CAPS Take 500 mg by mouth daily.     zinc gluconate 50 MG tablet Take 50 mg by mouth daily.     celecoxib (CELEBREX) 200 MG capsule Take 1 capsule (200 mg total) by mouth 2 (two) times daily. (Patient not taking: Reported on 01/28/2022) 60 capsule 0   docusate sodium (COLACE) 100 MG capsule Take 1 capsule (100 mg total) by mouth 2 (two) times daily. (Patient not taking: Reported on 01/28/2022) 10 capsule 0   HYDROcodone-acetaminophen (NORCO/VICODIN) 5-325 MG tablet Take 1-2 tablets by mouth every 6 (six) hours as needed for severe pain. (Patient not taking: Reported on 01/28/2022) 42 tablet 0   methocarbamol (ROBAXIN) 500 MG tablet Take 1 tablet (500 mg total) by mouth every 6 (six) hours as needed for muscle spasms. (Patient not taking: Reported on 01/28/2022) 40 tablet 0   No current facility-administered medications for this visit.    Family History  Problem Relation Age of Onset   Arthritis Mother    Hypertension Mother    Lung cancer Father    Stroke Maternal Grandmother    Hypertension Maternal Grandmother    Heart disease Paternal Grandfather    Breast cancer Neg Hx     Review of Systems  All other systems reviewed and are negative.  Exam:   BP 116/70 (BP Location: Right Arm, Patient Position: Sitting, Cuff Size: Large)    Pulse 95    Ht 5\' 4"  (1.626 m) Comment: reported   Wt 181 lb (82.1 kg)    LMP 12/08/2002    BMI 31.07 kg/m   Height: 5\' 4"  (162.6 cm) (reported)  General appearance: alert, cooperative and appears stated age Head: Normocephalic, without obvious abnormality, atraumatic Lungs: clear to auscultation bilaterally Breasts: normal appearance, no masses or tenderness Heart: regular rate and rhythm Abdomen: soft, non-tender; bowel sounds normal; no masses,  no organomegaly Extremities: extremities normal, atraumatic, no cyanosis or edema Skin: Skin color, texture, turgor normal. No rashes  or lesions Lymph nodes: Cervical, supraclavicular, and axillary nodes normal. No abnormal inguinal nodes palpated Neurologic: Grossly normal   Pelvic: External genitalia:  open and draining lesion at edge of right labia majora, purulent drainage with compression present, induration present but no surrounding erythema              Urethra:  normal appearing urethra with no masses, tenderness or lesions              Bartholins and Skenes: normal                 Vagina: normal appearing  vagina with normal color and no discharge, no lesions              Cervix: no lesions              Pap taken: Yes.   Bimanual Exam:  Uterus:  normal size, contour, position, consistency, mobility, non-tender              Adnexa: no mass, fullness, tenderness               Rectovaginal: Confirms               Anus:  normal sphincter tone, no lesions  Chaperone, Octaviano Batty, CMA, was present for exam.  Assessment/Plan: 1. Postmenopausal - pap smear obtained today - MMG 11/2021 - coloscopy done with Dr. Benson Norway.  Release signed today. - BMD scheduled for June - vaccines reviewed/updated  2. Urgency of urination - POCT Urinalysis Dipstick  3. Cervical cancer screening - Cytology - PAP( [place]) - PR OBTAINING SCREEN PAP SMEAR  4. Vulvar abscess - sulfamethoxazole-trimethoprim (BACTRIM DS) 800-160 MG tablet; Take 1 tablet by mouth 2 (two) times daily.  Dispense: 14 tablet; Refill: 0 - WOUND CULTURE

## 2022-01-31 LAB — CYTOLOGY - PAP
Diagnosis: NEGATIVE
Diagnosis: REACTIVE

## 2022-02-02 LAB — WOUND CULTURE: Organism ID, Bacteria: NONE SEEN

## 2022-02-06 ENCOUNTER — Other Ambulatory Visit: Payer: Self-pay

## 2022-02-06 ENCOUNTER — Encounter (HOSPITAL_BASED_OUTPATIENT_CLINIC_OR_DEPARTMENT_OTHER): Payer: Self-pay | Admitting: Obstetrics & Gynecology

## 2022-02-06 ENCOUNTER — Ambulatory Visit (HOSPITAL_BASED_OUTPATIENT_CLINIC_OR_DEPARTMENT_OTHER): Payer: PPO | Admitting: Obstetrics & Gynecology

## 2022-02-06 VITALS — BP 120/73 | HR 80

## 2022-02-06 DIAGNOSIS — N764 Abscess of vulva: Secondary | ICD-10-CM | POA: Diagnosis not present

## 2022-02-06 DIAGNOSIS — R3915 Urgency of urination: Secondary | ICD-10-CM

## 2022-02-06 MED ORDER — MIRABEGRON ER 50 MG PO TB24: 50.0000 mg | ORAL_TABLET | Freq: Every day | ORAL | 0 refills | Status: DC

## 2022-02-06 MED ORDER — SULFAMETHOXAZOLE-TRIMETHOPRIM 800-160 MG PO TABS: 1.0000 | ORAL_TABLET | Freq: Two times a day (BID) | ORAL | 0 refills | Status: DC

## 2022-02-06 NOTE — Progress Notes (Signed)
GYNECOLOGY  VISIT ? ?CC:   recheck vulvar abscess ? ?HPI: ?71 y.o. G65P2004 Married White or Caucasian female here for recheck of vulvar abscess.  Wound culture results reviewed.  Pt feels it is much better but still draining a little bit. ? ?Has second concern.  Experiencing increased urinary urgnecy and frequency since hip surgery.  Typically gets up 2-3 times at night.  Dip u/a at last visit was normal and she has been on Bactrim DS for the past week so is not a UTI ? ? ?Patient Active Problem List  ? Diagnosis Date Noted  ? S/P total right hip arthroplasty 12/10/2021  ? Pain in right knee 12/11/2017  ? Obese 10/21/2017  ? S/P right TKA 10/20/2017  ? ADD (attention deficit disorder) 07/27/2017  ? Insomnia 07/27/2017  ? Depression, major, single episode, in partial remission ([place]) 07/27/2017  ? Hypothyroidism 07/27/2017  ? S/P left THA, AA 09/30/2016  ? ? ?Past Medical History:  ?Diagnosis Date  ? ADD (attention deficit disorder)   ? Anemia   ? hx of   ? Anxiety   ? Arthritis   ? Depression   ? GERD (gastroesophageal reflux disease)   ? Heartburn   ? Hypothyroidism   ? ? ?Past Surgical History:  ?Procedure Laterality Date  ? deviated septum repair    ? KNEE ARTHROSCOPY Right 2011  ? left knee arthroscopy     ? TOTAL HIP ARTHROPLASTY Left 09/30/2016  ? Procedure: LEFT TOTAL HIP ARTHROPLASTY ANTERIOR APPROACH;  Surgeon: Paralee Cancel, MD;  Location: [facility];  Service: Orthopedics;  Laterality: Left;  ? TOTAL HIP ARTHROPLASTY Right 12/10/2021  ? Procedure: TOTAL HIP ARTHROPLASTY ANTERIOR APPROACH;  Surgeon: Paralee Cancel, MD;  Location: [facility];  Service: Orthopedics;  Laterality: Right;  ? TOTAL KNEE ARTHROPLASTY Right 10/20/2017  ? Procedure: RIGHT TOTAL KNEE ARTHROPLASTY;  Surgeon: Paralee Cancel, MD;  Location: [facility];  Service: Orthopedics;  Laterality: Right;  ? VARICOSE VEIN SURGERY    ? ? ?MEDS:   ?Current Outpatient Medications on File Prior to Visit  ?Medication Sig Dispense Refill  ? amphetamine-dextroamphetamine  (ADDERALL XR) 30 MG 24 hr capsule Take 30 mg by mouth daily.    ? Ascorbic Acid (VITAMIN C) 1000 MG tablet Take 1,000 mg by mouth daily.    ? b complex vitamins capsule Take 1 capsule by mouth daily.    ? buPROPion (WELLBUTRIN XL) 150 MG 24 hr tablet Take 1 tablet (150 mg total) by mouth daily. 90 tablet 0  ? calcium carbonate (OS-CAL) 600 MG TABS tablet Take 600 mg by mouth daily with breakfast.    ? Carboxymethylcellul-Glycerin (LUBRICATING EYE DROPS OP) Apply 1 drop to eye at bedtime as needed (dry eyes).    ? Cholecalciferol (VITAMIN D PO) Take 2,000 Units by mouth daily.    ? Desvenlafaxine Succinate ER 25 MG TB24 Take 25 mg by mouth daily.    ? levothyroxine (SYNTHROID, LEVOTHROID) 88 MCG tablet Take 88 mcg by mouth daily before breakfast.    ? MAGNESIUM PO Take 1 tablet by mouth daily.    ? Multiple Vitamin (MULTIVITAMIN) capsule Take 1 capsule by mouth daily.    ? Nutritional Supplements (IMMUNE ENHANCE) TABS Take 2 tablets by mouth daily.    ? omeprazole (PRILOSEC OTC) 20 MG tablet Take 20 mg by mouth daily as needed (acid reflux).    ? polyethylene glycol (MIRALAX / GLYCOLAX) packet Take 17 g 2 (two) times daily by mouth. (Patient taking differently: Take 17  g by mouth daily as needed for mild constipation.) 14 each 0  ? Probiotic Product (PROBIOTIC-10 ULTIMATE PO) Take 1 tablet by mouth daily.    ? traZODone (DESYREL) 100 MG tablet Take 50 mg by mouth at bedtime as needed for sleep.    ? Turmeric 500 MG CAPS Take 500 mg by mouth daily.    ? zinc gluconate 50 MG tablet Take 50 mg by mouth daily.    ? ?No current facility-administered medications on file prior to visit.  ? ? ?ALLERGIES: Metformin ? ?Family History  ?Problem Relation Age of Onset  ? Arthritis Mother   ? Hypertension Mother   ? Lung cancer Father   ? Stroke Maternal Grandmother   ? Hypertension Maternal Grandmother   ? Heart disease Paternal Grandfather   ? Breast cancer Neg Hx   ? ? ?SH:  married, non smoker ? ?Review of Systems   ?Constitutional: Negative.   ?Genitourinary:  Positive for frequency and urgency.  ? ?PHYSICAL EXAMINATION:   ? ?BP 120/73 (BP Location: Left Arm, Patient Position: Sitting, Cuff Size: Large)   Pulse 80   LMP 12/08/2002     ?General appearance: alert, cooperative and appears stated age ?Lymph:  no inguinal LAD noted ? ?Pelvic: External genitalia:  Significantly improved right groin vulvar abscess, tiny opening with scan drainage present ?             Urethra:  normal appearing urethra with no masses, tenderness or lesions ?             Bartholins and Skenes: normal    ?              ?Assessment/Plan: ?1. Vulvar abscess ?- culture results reviewed ?- sulfamethoxazole-trimethoprim (BACTRIM DS) 800-160 MG tablet; Take 1 tablet by mouth 2 (two) times daily.  Dispense: 14 tablet; Refill: 0 ? ?2. Urinary urgency ?- will try myrbetriq 50mg  daily.  #30/0RF.  Hypertension side effect discussed.  Pt is going to be with daughter in [place] with birth of grandchild.  Pt is going to check BP at home.  Recommend BP check in 2-4 weeks as well in office ? ? ? ?

## 2022-02-25 DIAGNOSIS — J069 Acute upper respiratory infection, unspecified: Secondary | ICD-10-CM | POA: Diagnosis not present

## 2022-02-25 DIAGNOSIS — J040 Acute laryngitis: Secondary | ICD-10-CM | POA: Diagnosis not present

## 2022-03-04 ENCOUNTER — Encounter (HOSPITAL_BASED_OUTPATIENT_CLINIC_OR_DEPARTMENT_OTHER): Payer: Self-pay | Admitting: *Deleted

## 2022-03-05 DIAGNOSIS — M1712 Unilateral primary osteoarthritis, left knee: Secondary | ICD-10-CM | POA: Diagnosis not present

## 2022-03-05 DIAGNOSIS — M25562 Pain in left knee: Secondary | ICD-10-CM | POA: Diagnosis not present

## 2022-03-05 DIAGNOSIS — Z96641 Presence of right artificial hip joint: Secondary | ICD-10-CM | POA: Diagnosis not present

## 2022-03-05 DIAGNOSIS — Z471 Aftercare following joint replacement surgery: Secondary | ICD-10-CM | POA: Diagnosis not present

## 2022-03-11 DIAGNOSIS — J40 Bronchitis, not specified as acute or chronic: Secondary | ICD-10-CM | POA: Diagnosis not present

## 2022-03-16 ENCOUNTER — Other Ambulatory Visit (HOSPITAL_BASED_OUTPATIENT_CLINIC_OR_DEPARTMENT_OTHER): Payer: Self-pay | Admitting: Obstetrics & Gynecology

## 2022-03-18 NOTE — Telephone Encounter (Signed)
LMOVM for pt to call office 

## 2022-04-04 ENCOUNTER — Other Ambulatory Visit: Payer: Self-pay | Admitting: Family Medicine

## 2022-04-04 ENCOUNTER — Ambulatory Visit
Admission: RE | Admit: 2022-04-04 | Discharge: 2022-04-04 | Disposition: A | Discharge status: Home / Self Care | Payer: PPO | Source: Ambulatory Visit | Attending: Family Medicine | Admitting: Family Medicine

## 2022-04-04 DIAGNOSIS — R052 Subacute cough: Secondary | ICD-10-CM | POA: Diagnosis not present

## 2022-04-04 DIAGNOSIS — R5383 Other fatigue: Secondary | ICD-10-CM | POA: Diagnosis not present

## 2022-04-04 DIAGNOSIS — R059 Cough, unspecified: Secondary | ICD-10-CM | POA: Diagnosis not present

## 2022-04-09 ENCOUNTER — Ambulatory Visit
Admission: RE | Admit: 2022-04-09 | Discharge: 2022-04-09 | Disposition: A | Discharge status: Home / Self Care | Payer: PPO | Source: Ambulatory Visit | Attending: Family Medicine | Admitting: Family Medicine

## 2022-04-09 DIAGNOSIS — Z780 Asymptomatic menopausal state: Secondary | ICD-10-CM | POA: Diagnosis not present

## 2022-04-09 DIAGNOSIS — E2839 Other primary ovarian failure: Secondary | ICD-10-CM

## 2022-04-09 DIAGNOSIS — M85832 Other specified disorders of bone density and structure, left forearm: Secondary | ICD-10-CM | POA: Diagnosis not present

## 2022-04-15 DIAGNOSIS — J189 Pneumonia, unspecified organism: Secondary | ICD-10-CM | POA: Diagnosis not present

## 2022-04-15 DIAGNOSIS — R059 Cough, unspecified: Secondary | ICD-10-CM | POA: Diagnosis not present

## 2022-05-13 DIAGNOSIS — I872 Venous insufficiency (chronic) (peripheral): Secondary | ICD-10-CM | POA: Diagnosis not present

## 2022-05-13 DIAGNOSIS — I781 Nevus, non-neoplastic: Secondary | ICD-10-CM | POA: Diagnosis not present

## 2022-05-14 DIAGNOSIS — Z96641 Presence of right artificial hip joint: Secondary | ICD-10-CM | POA: Diagnosis not present

## 2022-05-14 DIAGNOSIS — Z471 Aftercare following joint replacement surgery: Secondary | ICD-10-CM | POA: Diagnosis not present

## 2022-05-15 DIAGNOSIS — R635 Abnormal weight gain: Secondary | ICD-10-CM | POA: Diagnosis not present

## 2022-05-15 DIAGNOSIS — Z6831 Body mass index (BMI) 31.0-31.9, adult: Secondary | ICD-10-CM | POA: Diagnosis not present

## 2022-05-19 DIAGNOSIS — M25551 Pain in right hip: Secondary | ICD-10-CM | POA: Diagnosis not present

## 2022-05-19 DIAGNOSIS — M25651 Stiffness of right hip, not elsewhere classified: Secondary | ICD-10-CM | POA: Diagnosis not present

## 2022-05-21 DIAGNOSIS — M25551 Pain in right hip: Secondary | ICD-10-CM | POA: Diagnosis not present

## 2022-05-21 DIAGNOSIS — M25651 Stiffness of right hip, not elsewhere classified: Secondary | ICD-10-CM | POA: Diagnosis not present

## 2022-05-22 DIAGNOSIS — F324 Major depressive disorder, single episode, in partial remission: Secondary | ICD-10-CM | POA: Diagnosis not present

## 2022-05-22 DIAGNOSIS — E039 Hypothyroidism, unspecified: Secondary | ICD-10-CM | POA: Diagnosis not present

## 2022-05-22 DIAGNOSIS — F900 Attention-deficit hyperactivity disorder, predominantly inattentive type: Secondary | ICD-10-CM | POA: Diagnosis not present

## 2022-05-22 DIAGNOSIS — Z6832 Body mass index (BMI) 32.0-32.9, adult: Secondary | ICD-10-CM | POA: Diagnosis not present

## 2022-05-22 DIAGNOSIS — R7303 Prediabetes: Secondary | ICD-10-CM | POA: Diagnosis not present

## 2022-05-22 DIAGNOSIS — R635 Abnormal weight gain: Secondary | ICD-10-CM | POA: Diagnosis not present

## 2022-05-22 DIAGNOSIS — F5081 Binge eating disorder: Secondary | ICD-10-CM | POA: Diagnosis not present

## 2022-05-22 DIAGNOSIS — M8580 Other specified disorders of bone density and structure, unspecified site: Secondary | ICD-10-CM | POA: Diagnosis not present

## 2022-05-27 ENCOUNTER — Other Ambulatory Visit (HOSPITAL_BASED_OUTPATIENT_CLINIC_OR_DEPARTMENT_OTHER): Payer: Self-pay

## 2022-05-27 DIAGNOSIS — M25651 Stiffness of right hip, not elsewhere classified: Secondary | ICD-10-CM | POA: Diagnosis not present

## 2022-05-27 DIAGNOSIS — M25551 Pain in right hip: Secondary | ICD-10-CM | POA: Diagnosis not present

## 2022-05-28 ENCOUNTER — Other Ambulatory Visit (HOSPITAL_BASED_OUTPATIENT_CLINIC_OR_DEPARTMENT_OTHER): Payer: Self-pay

## 2022-05-28 DIAGNOSIS — H2513 Age-related nuclear cataract, bilateral: Secondary | ICD-10-CM | POA: Diagnosis not present

## 2022-05-28 DIAGNOSIS — H25013 Cortical age-related cataract, bilateral: Secondary | ICD-10-CM | POA: Diagnosis not present

## 2022-05-29 ENCOUNTER — Other Ambulatory Visit (HOSPITAL_BASED_OUTPATIENT_CLINIC_OR_DEPARTMENT_OTHER): Payer: Self-pay

## 2022-05-29 DIAGNOSIS — M25651 Stiffness of right hip, not elsewhere classified: Secondary | ICD-10-CM | POA: Diagnosis not present

## 2022-05-29 DIAGNOSIS — Z96643 Presence of artificial hip joint, bilateral: Secondary | ICD-10-CM | POA: Diagnosis not present

## 2022-05-29 DIAGNOSIS — Z96641 Presence of right artificial hip joint: Secondary | ICD-10-CM | POA: Diagnosis not present

## 2022-05-29 DIAGNOSIS — M1712 Unilateral primary osteoarthritis, left knee: Secondary | ICD-10-CM | POA: Diagnosis not present

## 2022-05-29 DIAGNOSIS — M25562 Pain in left knee: Secondary | ICD-10-CM | POA: Diagnosis not present

## 2022-05-29 DIAGNOSIS — M25551 Pain in right hip: Secondary | ICD-10-CM | POA: Diagnosis not present

## 2022-05-29 DIAGNOSIS — Z96653 Presence of artificial knee joint, bilateral: Secondary | ICD-10-CM | POA: Diagnosis not present

## 2022-05-29 MED ORDER — AMPHETAMINE-DEXTROAMPHET ER 30 MG PO CP24
ORAL_CAPSULE | ORAL | 0 refills | Status: AC
  Filled 2022-05-29: qty 30, 30d supply, fill #0

## 2022-06-03 DIAGNOSIS — M25551 Pain in right hip: Secondary | ICD-10-CM | POA: Diagnosis not present

## 2022-06-03 DIAGNOSIS — M25651 Stiffness of right hip, not elsewhere classified: Secondary | ICD-10-CM | POA: Diagnosis not present

## 2022-06-05 DIAGNOSIS — M25651 Stiffness of right hip, not elsewhere classified: Secondary | ICD-10-CM | POA: Diagnosis not present

## 2022-06-05 DIAGNOSIS — M25551 Pain in right hip: Secondary | ICD-10-CM | POA: Diagnosis not present

## 2022-06-05 DIAGNOSIS — E669 Obesity, unspecified: Secondary | ICD-10-CM | POA: Diagnosis not present

## 2022-06-05 DIAGNOSIS — Z6832 Body mass index (BMI) 32.0-32.9, adult: Secondary | ICD-10-CM | POA: Diagnosis not present

## 2022-06-09 DIAGNOSIS — M25551 Pain in right hip: Secondary | ICD-10-CM | POA: Diagnosis not present

## 2022-06-09 DIAGNOSIS — M25651 Stiffness of right hip, not elsewhere classified: Secondary | ICD-10-CM | POA: Diagnosis not present

## 2022-06-12 DIAGNOSIS — Z6832 Body mass index (BMI) 32.0-32.9, adult: Secondary | ICD-10-CM | POA: Diagnosis not present

## 2022-06-12 DIAGNOSIS — E669 Obesity, unspecified: Secondary | ICD-10-CM | POA: Diagnosis not present

## 2022-06-17 ENCOUNTER — Other Ambulatory Visit (HOSPITAL_BASED_OUTPATIENT_CLINIC_OR_DEPARTMENT_OTHER): Payer: Self-pay

## 2022-06-17 DIAGNOSIS — M25551 Pain in right hip: Secondary | ICD-10-CM | POA: Diagnosis not present

## 2022-06-17 DIAGNOSIS — M25651 Stiffness of right hip, not elsewhere classified: Secondary | ICD-10-CM | POA: Diagnosis not present

## 2022-06-20 DIAGNOSIS — E669 Obesity, unspecified: Secondary | ICD-10-CM | POA: Diagnosis not present

## 2022-06-20 DIAGNOSIS — Z6832 Body mass index (BMI) 32.0-32.9, adult: Secondary | ICD-10-CM | POA: Diagnosis not present

## 2022-06-25 DIAGNOSIS — Z6832 Body mass index (BMI) 32.0-32.9, adult: Secondary | ICD-10-CM | POA: Diagnosis not present

## 2022-06-25 DIAGNOSIS — E669 Obesity, unspecified: Secondary | ICD-10-CM | POA: Diagnosis not present

## 2022-07-14 DIAGNOSIS — F900 Attention-deficit hyperactivity disorder, predominantly inattentive type: Secondary | ICD-10-CM | POA: Diagnosis not present

## 2022-07-14 DIAGNOSIS — E039 Hypothyroidism, unspecified: Secondary | ICD-10-CM | POA: Diagnosis not present

## 2022-07-14 DIAGNOSIS — J029 Acute pharyngitis, unspecified: Secondary | ICD-10-CM | POA: Diagnosis not present

## 2022-07-14 DIAGNOSIS — G4700 Insomnia, unspecified: Secondary | ICD-10-CM | POA: Diagnosis not present

## 2022-07-14 DIAGNOSIS — F321 Major depressive disorder, single episode, moderate: Secondary | ICD-10-CM | POA: Diagnosis not present

## 2022-07-22 DIAGNOSIS — E669 Obesity, unspecified: Secondary | ICD-10-CM | POA: Diagnosis not present

## 2022-07-22 DIAGNOSIS — Z6832 Body mass index (BMI) 32.0-32.9, adult: Secondary | ICD-10-CM | POA: Diagnosis not present

## 2022-08-12 DIAGNOSIS — H2511 Age-related nuclear cataract, right eye: Secondary | ICD-10-CM | POA: Diagnosis not present

## 2022-08-12 DIAGNOSIS — H25811 Combined forms of age-related cataract, right eye: Secondary | ICD-10-CM | POA: Diagnosis not present

## 2022-08-12 DIAGNOSIS — H25011 Cortical age-related cataract, right eye: Secondary | ICD-10-CM | POA: Diagnosis not present

## 2022-08-12 DIAGNOSIS — Z961 Presence of intraocular lens: Secondary | ICD-10-CM | POA: Diagnosis not present

## 2022-08-15 DIAGNOSIS — E669 Obesity, unspecified: Secondary | ICD-10-CM | POA: Diagnosis not present

## 2022-08-15 DIAGNOSIS — Z6830 Body mass index (BMI) 30.0-30.9, adult: Secondary | ICD-10-CM | POA: Diagnosis not present

## 2022-09-08 DIAGNOSIS — L814 Other melanin hyperpigmentation: Secondary | ICD-10-CM | POA: Diagnosis not present

## 2022-09-08 DIAGNOSIS — D485 Neoplasm of uncertain behavior of skin: Secondary | ICD-10-CM | POA: Diagnosis not present

## 2022-09-08 DIAGNOSIS — C44319 Basal cell carcinoma of skin of other parts of face: Secondary | ICD-10-CM | POA: Diagnosis not present

## 2022-09-08 DIAGNOSIS — D225 Melanocytic nevi of trunk: Secondary | ICD-10-CM | POA: Diagnosis not present

## 2022-09-08 DIAGNOSIS — L720 Epidermal cyst: Secondary | ICD-10-CM | POA: Diagnosis not present

## 2022-09-08 DIAGNOSIS — L821 Other seborrheic keratosis: Secondary | ICD-10-CM | POA: Diagnosis not present

## 2022-09-08 DIAGNOSIS — L819 Disorder of pigmentation, unspecified: Secondary | ICD-10-CM | POA: Diagnosis not present

## 2022-09-08 DIAGNOSIS — D1801 Hemangioma of skin and subcutaneous tissue: Secondary | ICD-10-CM | POA: Diagnosis not present

## 2022-09-08 DIAGNOSIS — L820 Inflamed seborrheic keratosis: Secondary | ICD-10-CM | POA: Diagnosis not present

## 2022-09-16 DIAGNOSIS — I83893 Varicose veins of bilateral lower extremities with other complications: Secondary | ICD-10-CM | POA: Diagnosis not present

## 2022-09-16 DIAGNOSIS — I872 Venous insufficiency (chronic) (peripheral): Secondary | ICD-10-CM | POA: Diagnosis not present

## 2022-09-24 DIAGNOSIS — Z6830 Body mass index (BMI) 30.0-30.9, adult: Secondary | ICD-10-CM | POA: Diagnosis not present

## 2022-09-24 DIAGNOSIS — E669 Obesity, unspecified: Secondary | ICD-10-CM | POA: Diagnosis not present

## 2022-10-14 DIAGNOSIS — H52202 Unspecified astigmatism, left eye: Secondary | ICD-10-CM | POA: Diagnosis not present

## 2022-10-14 DIAGNOSIS — H269 Unspecified cataract: Secondary | ICD-10-CM | POA: Diagnosis not present

## 2022-10-14 DIAGNOSIS — Z961 Presence of intraocular lens: Secondary | ICD-10-CM | POA: Diagnosis not present

## 2022-10-14 DIAGNOSIS — H2512 Age-related nuclear cataract, left eye: Secondary | ICD-10-CM | POA: Diagnosis not present

## 2022-10-14 DIAGNOSIS — H25812 Combined forms of age-related cataract, left eye: Secondary | ICD-10-CM | POA: Diagnosis not present

## 2022-10-14 DIAGNOSIS — H25012 Cortical age-related cataract, left eye: Secondary | ICD-10-CM | POA: Diagnosis not present

## 2022-10-19 ENCOUNTER — Encounter (HOSPITAL_COMMUNITY): Payer: Self-pay

## 2022-10-19 ENCOUNTER — Ambulatory Visit (HOSPITAL_COMMUNITY)
Admission: EM | Admit: 2022-10-19 | Discharge: 2022-10-19 | Disposition: A | Discharge status: Home / Self Care | Payer: PPO | Attending: Emergency Medicine | Admitting: Emergency Medicine

## 2022-10-19 DIAGNOSIS — J069 Acute upper respiratory infection, unspecified: Secondary | ICD-10-CM

## 2022-10-19 DIAGNOSIS — L03115 Cellulitis of right lower limb: Secondary | ICD-10-CM

## 2022-10-19 MED ORDER — DOXYCYCLINE HYCLATE 100 MG PO CAPS: 100.0000 mg | ORAL_CAPSULE | Freq: Two times a day (BID) | ORAL | 0 refills | Status: DC

## 2022-10-19 MED ORDER — BENZONATATE 100 MG PO CAPS: 100.0000 mg | ORAL_CAPSULE | Freq: Three times a day (TID) | ORAL | 0 refills | Status: DC

## 2022-10-19 NOTE — Discharge Instructions (Signed)
For cough -Likely started as a viral process from your grandkids but as it is protested for 2 weeks it is possible that bacteria is involved -Begin doxycycline every morning and every evening for the next 10 days, ideally we do not see improvement in 48 hours, this medicine will help with the wound to your leg as well -Use Tessalon pill every 8 hours to help calm your coughing, may use over-the-counter Delsym in addition to this -For that you are not in a warm dry stuffy environments as this will make your cough worse -Sure that you are drinking lots of fluids primarily water which will help thin out any secretions  For your leg -Exam there is redness and swelling and is also painful to touch which are all signs of infection therefore you have been started on doxycycline which will help with your airways as well -Stop use of peroxide and alcohol over the wound bed as sometimes this will dries it out and makes it more difficult to heal -You may cleanse during normal hygiene with diluted soapy water, pat dry and cover with a nonstick bandage -Use follow-up for reevaluation with urgent care or your primary doctor if you have concerns regarding healing

## 2022-10-19 NOTE — ED Provider Notes (Signed)
[facility]    CSN: [id number] Arrival date & time: 10/19/22  1618      History   Chief Complaint Chief Complaint  Patient presents with   Cough   Knee Injury    HPI Sarah James is a 71 y.o. female.   Patient presents with a congested cough for 2 weeks, associated general malaise and fatigue.  Known sick contacts.  Home COVID test negative twice.  Not attempted treatment.  Denies shortness of breath or wheezing, fever, chills, ear pain, nasal congestion or sore throat.  Patient concerned with abrasion to the right hip or shin beginning 7 days ago after hitting it on furniture.  It has become tender, erythematous and swollen.  Has been cleansing with peroxide and alcohol and covering with nonadhesive.  Denies drainage, fever or chills.       Past Medical History:  Diagnosis Date   ADD (attention deficit disorder)    Anemia    hx of    Anxiety    Arthritis    Depression    GERD (gastroesophageal reflux disease)    Heartburn    Hypothyroidism     Patient Active Problem List   Diagnosis Date Noted   S/P total right hip arthroplasty 12/10/2021   Pain in right knee 12/11/2017   Obese 10/21/2017   S/P right TKA 10/20/2017   ADD (attention deficit disorder) 07/27/2017   Insomnia 07/27/2017   Depression, major, single episode, in partial remission ([place]) 07/27/2017   Hypothyroidism 07/27/2017   S/P left THA, AA 09/30/2016    Past Surgical History:  Procedure Laterality Date   deviated septum repair     KNEE ARTHROSCOPY Right 2011   left knee arthroscopy      TOTAL HIP ARTHROPLASTY Left 09/30/2016   Procedure: LEFT TOTAL HIP ARTHROPLASTY ANTERIOR APPROACH;  Surgeon: Paralee Cancel, MD;  Location: [facility];  Service: Orthopedics;  Laterality: Left;   TOTAL HIP ARTHROPLASTY Right 12/10/2021   Procedure: TOTAL HIP ARTHROPLASTY ANTERIOR APPROACH;  Surgeon: Paralee Cancel, MD;  Location: [facility];  Service: Orthopedics;  Laterality: Right;   TOTAL KNEE  ARTHROPLASTY Right 10/20/2017   Procedure: RIGHT TOTAL KNEE ARTHROPLASTY;  Surgeon: Paralee Cancel, MD;  Location: [facility];  Service: Orthopedics;  Laterality: Right;   VARICOSE VEIN SURGERY      OB History     Gravida  2   Para  2   Term  2   Preterm      AB      Living  4      SAB      IAB      Ectopic      Multiple      Live Births  2            Home Medications    Prior to Admission medications   Medication Sig Start Date End Date Taking? Authorizing Provider  amphetamine-dextroamphetamine (ADDERALL XR) 30 MG 24 hr capsule Take 30 mg by mouth daily. 10/28/21  Yes [provider]  amphetamine-dextroamphetamine (ADDERALL XR) 30 MG 24 hr capsule 1 capsule in the morning Orally Once a day 30 days 05/28/22  Yes   Ascorbic Acid (VITAMIN C) 1000 MG tablet Take 1,000 mg by mouth daily.   Yes [provider]  b complex vitamins capsule Take 1 capsule by mouth daily.   Yes [provider]  buPROPion (WELLBUTRIN XL) 150 MG 24 hr tablet Take 1 tablet (150 mg total) by mouth daily. 07/27/17  Yes Martin[place], Betty G, MD  calcium carbonate (OS-CAL) 600 MG TABS tablet Take 600 mg by mouth daily with breakfast.   Yes [provider]  Cholecalciferol (VITAMIN D PO) Take 2,000 Units by mouth daily.   Yes [provider]  levothyroxine (SYNTHROID, LEVOTHROID) 88 MCG tablet Take 88 mcg by mouth daily before breakfast.   Yes [provider]  MAGNESIUM PO Take 1 tablet by mouth daily.   Yes [provider]  Multiple Vitamin (MULTIVITAMIN) capsule Take 1 capsule by mouth daily.   Yes [provider]  Nutritional Supplements (IMMUNE ENHANCE) TABS Take 2 tablets by mouth daily.   Yes [provider]  omeprazole (PRILOSEC OTC) 20 MG tablet Take 20 mg by mouth daily as needed (acid reflux).   Yes [provider]  traZODone (DESYREL) 100 MG tablet Take 50 mg by mouth at bedtime as needed for sleep. 02/06/21   Yes [provider]  Turmeric 500 MG CAPS Take 500 mg by mouth daily.   Yes [provider]  zinc gluconate 50 MG tablet Take 50 mg by mouth daily.   Yes [provider]  Carboxymethylcellul-Glycerin (LUBRICATING EYE DROPS OP) Apply 1 drop to eye at bedtime as needed (dry eyes).    [provider]  Desvenlafaxine Succinate ER 25 MG TB24 Take 25 mg by mouth daily. 02/06/21   [provider]  polyethylene glycol (MIRALAX / GLYCOLAX) packet Take 17 g 2 (two) times daily by mouth. Patient taking differently: Take 17 g by mouth daily as needed for mild constipation. 10/20/17   Danae Orleans, PA-C  Probiotic Product (PROBIOTIC-10 ULTIMATE PO) Take 1 tablet by mouth daily.    [provider]  sulfamethoxazole-trimethoprim (BACTRIM DS) 800-160 MG tablet Take 1 tablet by mouth 2 (two) times daily. 02/06/22   Megan Salon, MD    Family History Family History  Problem Relation Age of Onset   Arthritis Mother    Hypertension Mother    Lung cancer Father    Stroke Maternal Grandmother    Hypertension Maternal Grandmother    Heart disease Paternal Grandfather    Breast cancer Neg Hx     Social History Social History   Tobacco Use   Smoking status: Former    Types: Cigarettes    Quit date: 12/08/1990    Years since quitting: 31.8   Smokeless tobacco: Never  Vaping Use   Vaping Use: Never used  Substance Use Topics   Alcohol use: Yes    Alcohol/week: 0.0 standard drinks of alcohol    Comment: occasional    Drug use: No     Allergies   Metformin   Review of Systems Review of Systems  Constitutional:  Positive for fatigue. Negative for activity change, appetite change, chills, diaphoresis, fever and unexpected weight change.  HENT: Negative.    Respiratory:  Positive for cough. Negative for apnea, choking, chest tightness, shortness of breath, wheezing and stridor.   Cardiovascular: Negative.   Gastrointestinal: Negative.   Skin:   Positive for wound. Negative for color change, pallor and rash.     Physical Exam Triage Vital Signs ED Triage Vitals  Enc Vitals Group     BP 10/19/22 1704 113/80     Pulse Rate 10/19/22 1704 84     Resp 10/19/22 1704 16     Temp 10/19/22 1704 98.3 F (36.8 C)     Temp Source 10/19/22 1704 Oral     SpO2 10/19/22 1704 98 %  Weight --      Height --      Head Circumference --      Peak Flow --      Pain Score 10/19/22 1701 6     Pain Loc --      Pain Edu? --      Excl. in [place]? --    No data found.  Updated Vital Signs BP 113/80 (BP Location: Right Arm)   Pulse 84   Temp 98.3 F (36.8 C) (Oral)   Resp 16   LMP 12/08/2002   SpO2 98%   Visual Acuity Right Eye Distance:   Left Eye Distance:   Bilateral Distance:    Right Eye Near:   Left Eye Near:    Bilateral Near:     Physical Exam Constitutional:      Appearance: Normal appearance.  HENT:     Head: Normocephalic.     Right Ear: Tympanic membrane, ear canal and external ear normal.     Left Ear: Tympanic membrane, ear canal and external ear normal.     Nose: Nose normal.     Mouth/Throat:     Mouth: Mucous membranes are moist.     Pharynx: Oropharynx is clear.  Eyes:     Extraocular Movements: Extraocular movements intact.  Cardiovascular:     Rate and Rhythm: Normal rate and regular rhythm.     Pulses: Normal pulses.     Heart sounds: Normal heart sounds.  Pulmonary:     Effort: Pulmonary effort is normal.     Breath sounds: Normal breath sounds.  Skin:    Comments: 1x2 abrasion present to the anterior right shin, erythematous, tenderness and mild to moderate swelling present, unable to expel drainage  Neurological:     Mental Status: She is alert and oriented to person, place, and time. Mental status is at baseline.  Psychiatric:        Mood and Affect: Mood normal.        Behavior: Behavior normal.      UC Treatments / Results  Labs (all labs ordered are listed, but only abnormal results  are displayed) Labs Reviewed - No data to display  EKG   Radiology No results found.  Procedures Procedures (including critical care time)  Medications Ordered in UC Medications - No data to display  Initial Impression / Assessment and Plan / UC Course  I have reviewed the triage vital signs and the nursing notes.  Pertinent labs & imaging results that were available during my care of the patient were reviewed by me and considered in my medical decision making (see chart for details).  Acute URI, cellulitis of right leg  Vital signs are stable, patient is in no signs of distress, lungs are clear to auscultation, O2 saturation is 98% on room air, symptoms have persisted for 2 weeks we will provide bacterial coverage, doxycycline prescribed to provide coverage to the cellulitis and infection to the abrasion to the right leg, tessalon prescribed and may use over-the-counter Delsym for additional support,  may use additional over-the-counter medications for additional discussed with patient, advised against use of alcohol and peroxide and may cleanse daily with diluted soapy water, given strict precautions to follow-up if symptoms do not improve, verbalized understanding Final Clinical Impressions(s) / UC Diagnoses   Final diagnoses:  None   Discharge Instructions   None    ED Prescriptions   None    PDMP not reviewed this encounter.   Mazi Brailsford, [place],  NP 10/19/22 1804

## 2022-10-19 NOTE — ED Triage Notes (Signed)
Cough onset 2 weeks after going to see her grand kids. Dry cough. Tested for Covid and was negative, no other symptoms.   Right knee and lower shin hit against furniture. Patient broke the kid, area is red and swollen. Onset 1 week ago.

## 2022-10-20 ENCOUNTER — Telehealth (HOSPITAL_COMMUNITY): Payer: Self-pay | Admitting: Emergency Medicine

## 2022-10-20 MED ORDER — PROMETHAZINE-DM 6.25-15 MG/5ML PO SYRP: 2.5000 mL | ORAL_SOLUTION | Freq: Four times a day (QID) | ORAL | 0 refills | Status: DC | PRN

## 2022-10-24 DIAGNOSIS — Z6830 Body mass index (BMI) 30.0-30.9, adult: Secondary | ICD-10-CM | POA: Diagnosis not present

## 2022-10-24 DIAGNOSIS — E669 Obesity, unspecified: Secondary | ICD-10-CM | POA: Diagnosis not present

## 2022-10-27 ENCOUNTER — Other Ambulatory Visit: Payer: Self-pay | Admitting: Family Medicine

## 2022-10-27 DIAGNOSIS — Z1231 Encounter for screening mammogram for malignant neoplasm of breast: Secondary | ICD-10-CM

## 2022-11-03 DIAGNOSIS — C44319 Basal cell carcinoma of skin of other parts of face: Secondary | ICD-10-CM | POA: Diagnosis not present

## 2022-11-03 DIAGNOSIS — Z85828 Personal history of other malignant neoplasm of skin: Secondary | ICD-10-CM | POA: Diagnosis not present

## 2022-11-19 DIAGNOSIS — K645 Perianal venous thrombosis: Secondary | ICD-10-CM | POA: Diagnosis not present

## 2022-11-19 DIAGNOSIS — K5900 Constipation, unspecified: Secondary | ICD-10-CM | POA: Diagnosis not present

## 2022-11-26 DIAGNOSIS — Z961 Presence of intraocular lens: Secondary | ICD-10-CM | POA: Diagnosis not present

## 2022-11-26 DIAGNOSIS — E669 Obesity, unspecified: Secondary | ICD-10-CM | POA: Diagnosis not present

## 2022-11-26 DIAGNOSIS — Z6830 Body mass index (BMI) 30.0-30.9, adult: Secondary | ICD-10-CM | POA: Diagnosis not present

## 2022-12-22 DIAGNOSIS — M1712 Unilateral primary osteoarthritis, left knee: Secondary | ICD-10-CM | POA: Diagnosis not present

## 2022-12-22 DIAGNOSIS — M25562 Pain in left knee: Secondary | ICD-10-CM | POA: Diagnosis not present

## 2022-12-22 DIAGNOSIS — Z96641 Presence of right artificial hip joint: Secondary | ICD-10-CM | POA: Diagnosis not present

## 2022-12-22 DIAGNOSIS — Z96651 Presence of right artificial knee joint: Secondary | ICD-10-CM | POA: Diagnosis not present

## 2022-12-22 DIAGNOSIS — Z96643 Presence of artificial hip joint, bilateral: Secondary | ICD-10-CM | POA: Diagnosis not present

## 2022-12-22 DIAGNOSIS — Z96642 Presence of left artificial hip joint: Secondary | ICD-10-CM | POA: Diagnosis not present

## 2022-12-26 ENCOUNTER — Ambulatory Visit
Admission: RE | Admit: 2022-12-26 | Discharge: 2022-12-26 | Disposition: A | Discharge status: Home / Self Care | Payer: PPO | Source: Ambulatory Visit | Attending: Family Medicine | Admitting: Family Medicine

## 2022-12-26 DIAGNOSIS — Z1231 Encounter for screening mammogram for malignant neoplasm of breast: Secondary | ICD-10-CM

## 2023-04-21 DIAGNOSIS — G4700 Insomnia, unspecified: Secondary | ICD-10-CM | POA: Diagnosis not present

## 2023-04-21 DIAGNOSIS — E6609 Other obesity due to excess calories: Secondary | ICD-10-CM | POA: Diagnosis not present

## 2023-04-21 DIAGNOSIS — Z23 Encounter for immunization: Secondary | ICD-10-CM | POA: Diagnosis not present

## 2023-04-21 DIAGNOSIS — Z79899 Other long term (current) drug therapy: Secondary | ICD-10-CM | POA: Diagnosis not present

## 2023-04-21 DIAGNOSIS — F900 Attention-deficit hyperactivity disorder, predominantly inattentive type: Secondary | ICD-10-CM | POA: Diagnosis not present

## 2023-04-21 DIAGNOSIS — Z0001 Encounter for general adult medical examination with abnormal findings: Secondary | ICD-10-CM | POA: Diagnosis not present

## 2023-04-21 DIAGNOSIS — E039 Hypothyroidism, unspecified: Secondary | ICD-10-CM | POA: Diagnosis not present

## 2023-04-21 DIAGNOSIS — R7301 Impaired fasting glucose: Secondary | ICD-10-CM | POA: Diagnosis not present

## 2023-04-21 DIAGNOSIS — F321 Major depressive disorder, single episode, moderate: Secondary | ICD-10-CM | POA: Diagnosis not present

## 2023-04-21 DIAGNOSIS — Z6831 Body mass index (BMI) 31.0-31.9, adult: Secondary | ICD-10-CM | POA: Diagnosis not present

## 2023-04-22 ENCOUNTER — Ambulatory Visit (HOSPITAL_BASED_OUTPATIENT_CLINIC_OR_DEPARTMENT_OTHER): Payer: PPO | Admitting: Obstetrics & Gynecology

## 2023-06-18 DIAGNOSIS — R7303 Prediabetes: Secondary | ICD-10-CM | POA: Diagnosis not present

## 2023-06-18 DIAGNOSIS — Z713 Dietary counseling and surveillance: Secondary | ICD-10-CM | POA: Diagnosis not present

## 2023-06-18 DIAGNOSIS — E663 Overweight: Secondary | ICD-10-CM | POA: Diagnosis not present

## 2023-06-18 DIAGNOSIS — Z6829 Body mass index (BMI) 29.0-29.9, adult: Secondary | ICD-10-CM | POA: Diagnosis not present

## 2023-06-18 DIAGNOSIS — E039 Hypothyroidism, unspecified: Secondary | ICD-10-CM | POA: Diagnosis not present

## 2023-07-01 DIAGNOSIS — M1712 Unilateral primary osteoarthritis, left knee: Secondary | ICD-10-CM | POA: Diagnosis not present

## 2023-07-16 IMAGING — MG MM DIGITAL SCREENING BILAT W/ TOMO AND CAD
6 of 12 series · 6 of 36 positions shown · non-contrast
Comparison: Previous exam(s).

CLINICAL DATA: Screening.

EXAM:
DIGITAL SCREENING BILATERAL MAMMOGRAM WITH TOMOSYNTHESIS AND CAD
TECHNIQUE: Bilateral screening digital craniocaudal and mediolateral oblique
mammograms were obtained. Bilateral screening digital breast
tomosynthesis was performed. The images were evaluated with
computer-aided detection.

[L CC synth-2D (1 of 2)]
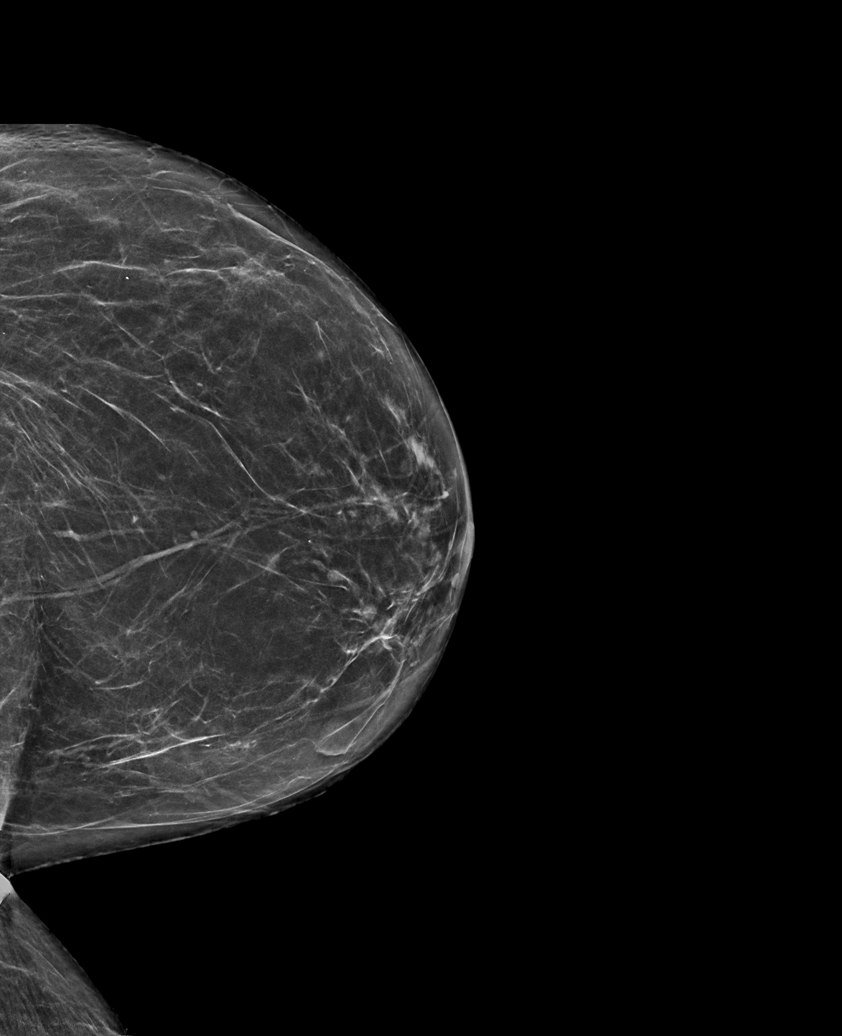

[R XCCM synth-2D]
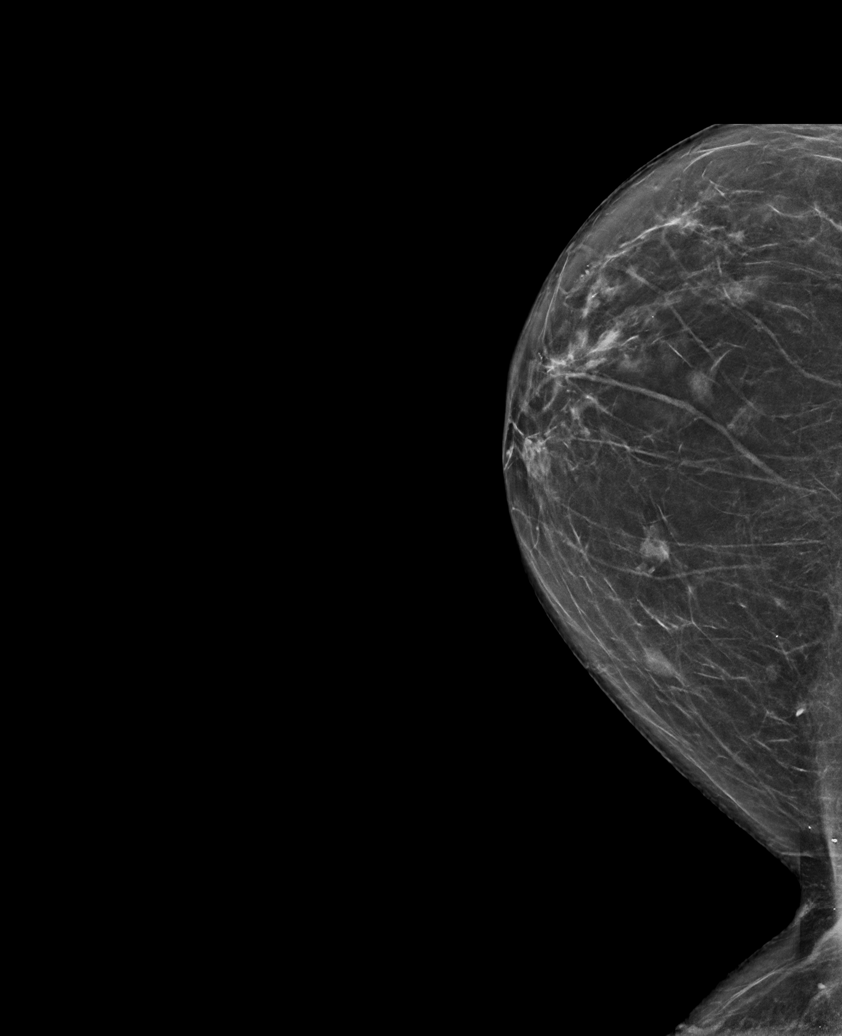

[R MLO synth-2D]
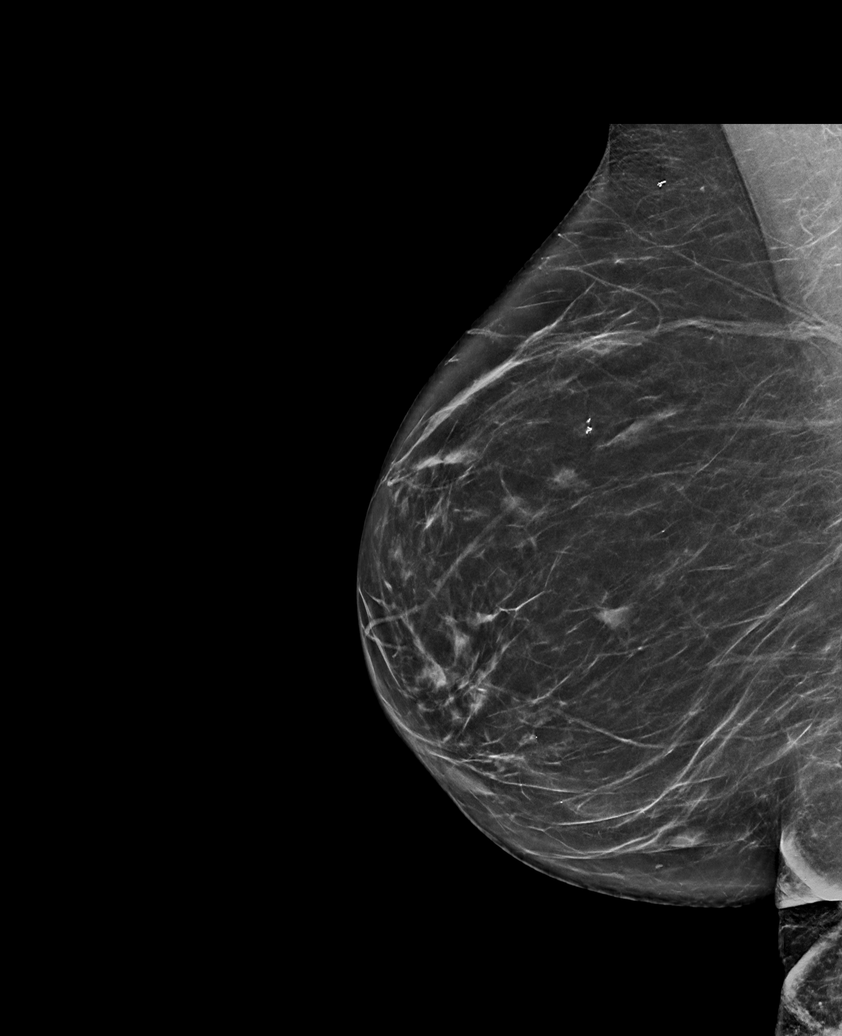

[R CC synth-2D]
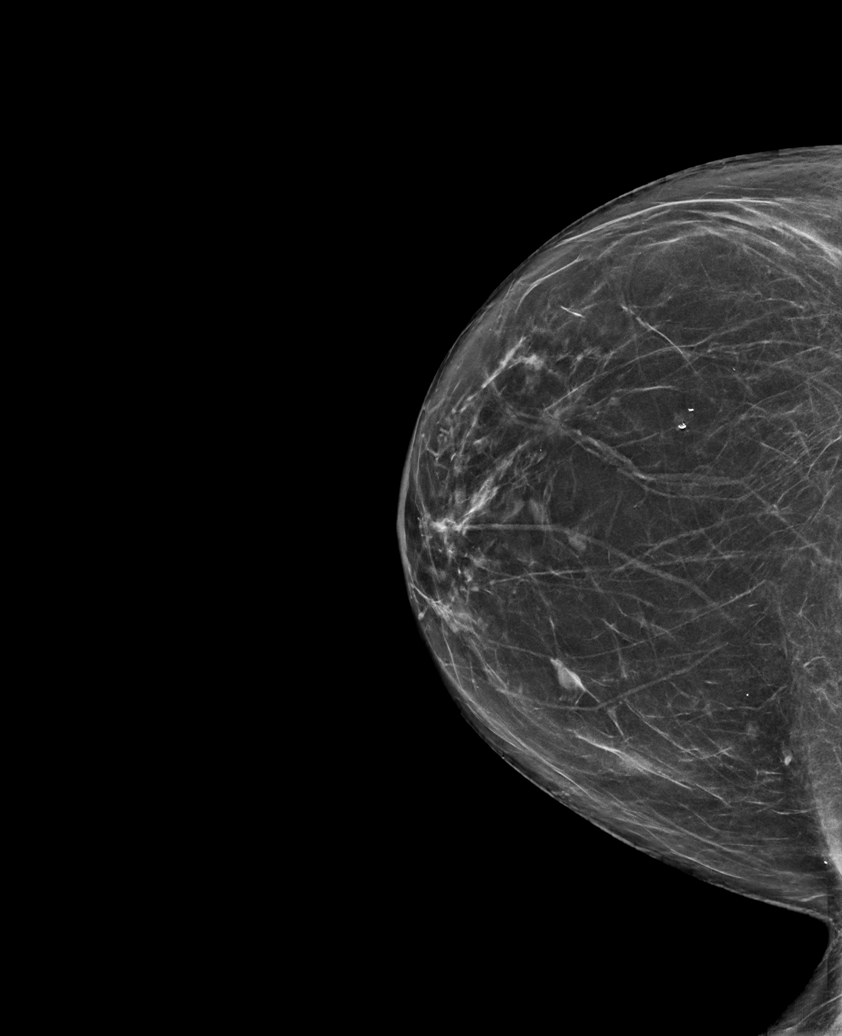

[L CC synth-2D (2 of 2)]
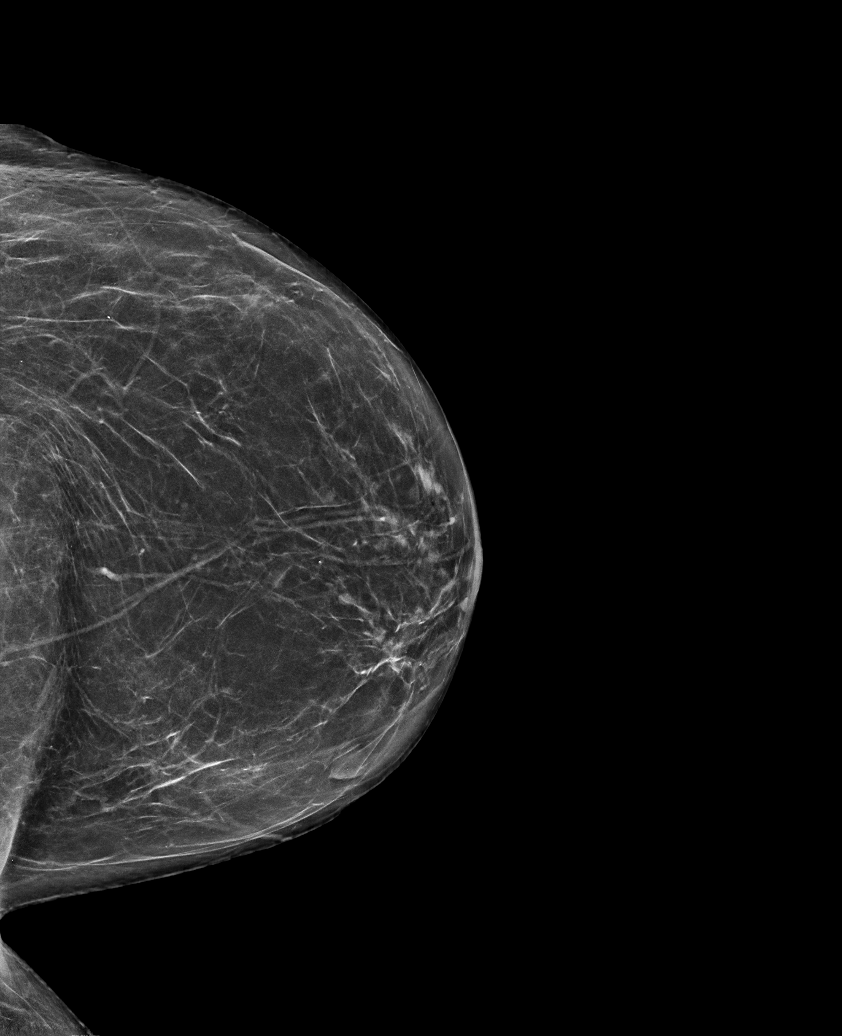

[L MLO synth-2D]
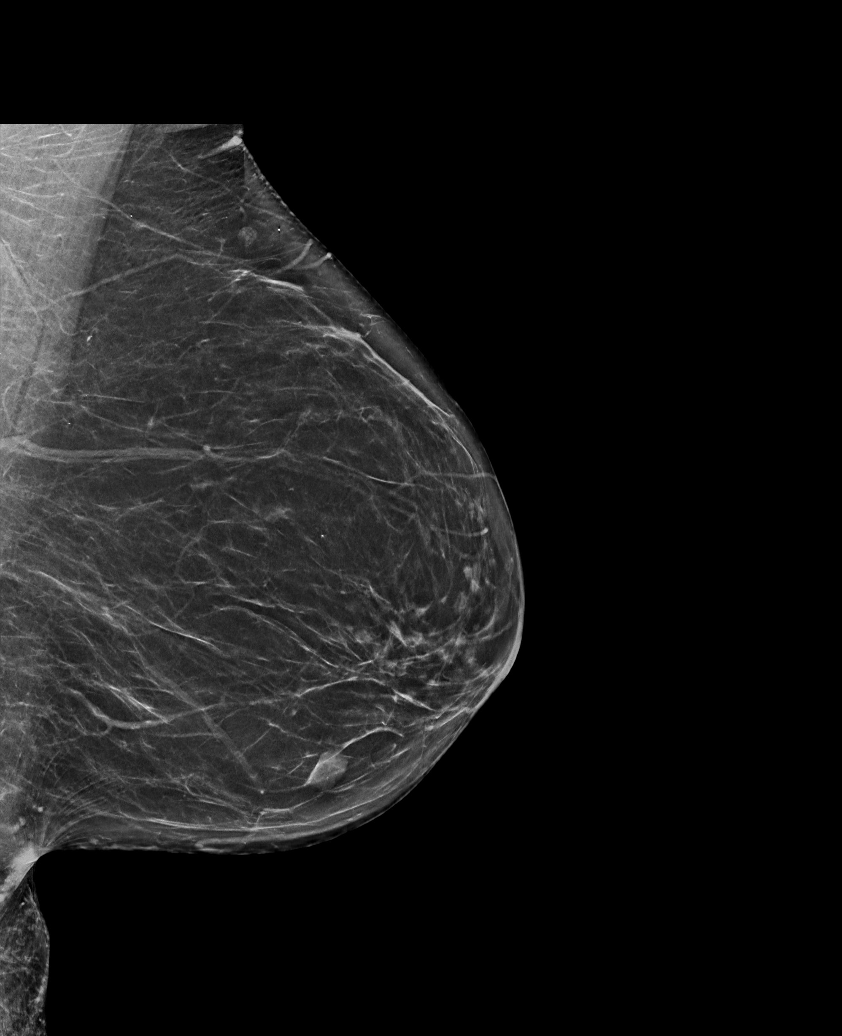

[6 of 36 positions shown; findings below may reference images not displayed]

ACR Breast Density Category b: There are scattered areas of
fibroglandular density.
FINDINGS: There are no findings suspicious for malignancy.
IMPRESSION: No mammographic evidence of malignancy. A result letter of this
screening mammogram will be mailed directly to the patient.

RECOMMENDATION:
Screening mammogram in one year. (Code:51-O-LD2)

BI-RADS CATEGORY  1: Negative.

## 2023-09-08 ENCOUNTER — Other Ambulatory Visit (HOSPITAL_BASED_OUTPATIENT_CLINIC_OR_DEPARTMENT_OTHER): Payer: Self-pay

## 2023-09-08 MED ORDER — TRAZODONE HCL 100 MG PO TABS
100.0000 mg | ORAL_TABLET | Freq: Every evening | ORAL | 1 refills | Status: AC | PRN
  Filled 2023-09-08: qty 90, 90d supply, fill #0

## 2023-09-17 DIAGNOSIS — M79605 Pain in left leg: Secondary | ICD-10-CM | POA: Diagnosis not present

## 2023-09-17 DIAGNOSIS — M545 Low back pain, unspecified: Secondary | ICD-10-CM | POA: Diagnosis not present

## 2023-09-17 DIAGNOSIS — R269 Unspecified abnormalities of gait and mobility: Secondary | ICD-10-CM | POA: Diagnosis not present

## 2023-09-24 DIAGNOSIS — M545 Low back pain, unspecified: Secondary | ICD-10-CM | POA: Diagnosis not present

## 2023-09-24 DIAGNOSIS — R269 Unspecified abnormalities of gait and mobility: Secondary | ICD-10-CM | POA: Diagnosis not present

## 2023-09-24 DIAGNOSIS — M79605 Pain in left leg: Secondary | ICD-10-CM | POA: Diagnosis not present

## 2023-09-30 DIAGNOSIS — M79605 Pain in left leg: Secondary | ICD-10-CM | POA: Diagnosis not present

## 2023-09-30 DIAGNOSIS — R269 Unspecified abnormalities of gait and mobility: Secondary | ICD-10-CM | POA: Diagnosis not present

## 2023-09-30 DIAGNOSIS — M545 Low back pain, unspecified: Secondary | ICD-10-CM | POA: Diagnosis not present

## 2023-10-12 DIAGNOSIS — F321 Major depressive disorder, single episode, moderate: Secondary | ICD-10-CM | POA: Diagnosis not present

## 2023-10-12 DIAGNOSIS — Z23 Encounter for immunization: Secondary | ICD-10-CM | POA: Diagnosis not present

## 2023-10-12 DIAGNOSIS — R7301 Impaired fasting glucose: Secondary | ICD-10-CM | POA: Diagnosis not present

## 2023-10-12 DIAGNOSIS — G4700 Insomnia, unspecified: Secondary | ICD-10-CM | POA: Diagnosis not present

## 2023-10-12 DIAGNOSIS — Z79899 Other long term (current) drug therapy: Secondary | ICD-10-CM | POA: Diagnosis not present

## 2023-10-12 DIAGNOSIS — E039 Hypothyroidism, unspecified: Secondary | ICD-10-CM | POA: Diagnosis not present

## 2023-10-12 DIAGNOSIS — F900 Attention-deficit hyperactivity disorder, predominantly inattentive type: Secondary | ICD-10-CM | POA: Diagnosis not present

## 2023-10-19 DIAGNOSIS — M79605 Pain in left leg: Secondary | ICD-10-CM | POA: Diagnosis not present

## 2023-10-19 DIAGNOSIS — M545 Low back pain, unspecified: Secondary | ICD-10-CM | POA: Diagnosis not present

## 2023-10-19 DIAGNOSIS — R269 Unspecified abnormalities of gait and mobility: Secondary | ICD-10-CM | POA: Diagnosis not present

## 2023-10-21 ENCOUNTER — Ambulatory Visit (HOSPITAL_BASED_OUTPATIENT_CLINIC_OR_DEPARTMENT_OTHER): Payer: PPO | Admitting: Obstetrics & Gynecology

## 2023-10-21 ENCOUNTER — Encounter (HOSPITAL_BASED_OUTPATIENT_CLINIC_OR_DEPARTMENT_OTHER): Payer: Self-pay | Admitting: Obstetrics & Gynecology

## 2023-10-21 VITALS — BP 108/77 | HR 81 | Ht 64.0 in | Wt 174.4 lb

## 2023-10-21 DIAGNOSIS — Z780 Asymptomatic menopausal state: Secondary | ICD-10-CM

## 2023-10-21 DIAGNOSIS — Z01419 Encounter for gynecological examination (general) (routine) without abnormal findings: Secondary | ICD-10-CM

## 2023-10-21 NOTE — Progress Notes (Signed)
72 y.o. G5P2004 Married White or Caucasian female here for breast and pelvic exam.   Denies vaginal bleeding.  Doing well.  H/o vulvar abscess at last visit but nothing like it since.  Patient's last menstrual period was 12/08/2002.           Health Maintenance: PCP:  Dr. Docia Chuck.  Last wellness appt was late October.  Did blood work at that appt:  yes Vaccines are up to date:  has not done RSV Colonoscopy:  2020, follow up 5 years MMG:  12/29/2022 BMD:  04/09/2022 Last pap smear:  2023.     reports that she quit smoking about 32 years ago. Her smoking use included cigarettes. She has never used smokeless tobacco. She reports current alcohol use. She reports that she does not use drugs.  Past Medical History:  Diagnosis Date   ADD (attention deficit disorder)    Anemia    hx of    Anxiety    Arthritis    Depression    GERD (gastroesophageal reflux disease)    Heartburn    Hypothyroidism     Past Surgical History:  Procedure Laterality Date   deviated septum repair     KNEE ARTHROSCOPY Right 2011   left knee arthroscopy      TOTAL HIP ARTHROPLASTY Left 09/30/2016   Procedure: LEFT TOTAL HIP ARTHROPLASTY ANTERIOR APPROACH;  Surgeon: Durene Romans, MD;  Location: [facility];  Service: Orthopedics;  Laterality: Left;   TOTAL HIP ARTHROPLASTY Right 12/10/2021   Procedure: TOTAL HIP ARTHROPLASTY ANTERIOR APPROACH;  Surgeon: Durene Romans, MD;  Location: [facility];  Service: Orthopedics;  Laterality: Right;   TOTAL KNEE ARTHROPLASTY Right 10/20/2017   Procedure: RIGHT TOTAL KNEE ARTHROPLASTY;  Surgeon: Durene Romans, MD;  Location: [facility];  Service: Orthopedics;  Laterality: Right;   VARICOSE VEIN SURGERY      Current Outpatient Medications  Medication Sig Dispense Refill   amphetamine-dextroamphetamine (ADDERALL XR) 30 MG 24 hr capsule Take 30 mg by mouth daily.     amphetamine-dextroamphetamine (ADDERALL XR) 30 MG 24 hr capsule 1 capsule in the morning Orally Once a day 30 days 30 capsule  0   Ascorbic Acid (VITAMIN C) 1000 MG tablet Take 1,000 mg by mouth daily.     b complex vitamins capsule Take 1 capsule by mouth daily.     buPROPion (WELLBUTRIN XL) 150 MG 24 hr tablet Take 1 tablet (150 mg total) by mouth daily. 90 tablet 0   calcium carbonate (OS-CAL) 600 MG TABS tablet Take 600 mg by mouth daily with breakfast.     Carboxymethylcellul-Glycerin (LUBRICATING EYE DROPS OP) Apply 1 drop to eye at bedtime as needed (dry eyes).     Cholecalciferol (VITAMIN D PO) Take 2,000 Units by mouth daily.     Desvenlafaxine Succinate ER 25 MG TB24 Take 25 mg by mouth daily.     levothyroxine (SYNTHROID, LEVOTHROID) 88 MCG tablet Take 88 mcg by mouth daily before breakfast.     MAGNESIUM PO Take 1 tablet by mouth daily.     Multiple Vitamin (MULTIVITAMIN) capsule Take 1 capsule by mouth daily.     Nutritional Supplements (IMMUNE ENHANCE) TABS Take 2 tablets by mouth daily.     omeprazole (PRILOSEC OTC) 20 MG tablet Take 20 mg by mouth daily as needed (acid reflux).     polyethylene glycol (MIRALAX / GLYCOLAX) packet Take 17 g 2 (two) times daily by mouth. (Patient taking differently: Take 17 g by mouth daily as needed for  mild constipation.) 14 each 0   Probiotic Product (PROBIOTIC-10 ULTIMATE PO) Take 1 tablet by mouth daily.     traZODone (DESYREL) 100 MG tablet Take 50 mg by mouth at bedtime as needed for sleep.     traZODone (DESYREL) 100 MG tablet Take 1 tablet (100 mg total) by mouth at bedtime as needed. 90 tablet 1   Turmeric 500 MG CAPS Take 500 mg by mouth daily.     zinc gluconate 50 MG tablet Take 50 mg by mouth daily.     No current facility-administered medications for this visit.    Family History  Problem Relation Age of Onset   Arthritis Mother    Hypertension Mother    Lung cancer Father    Stroke Maternal Grandmother    Hypertension Maternal Grandmother    Heart disease Paternal Grandfather    Breast cancer Neg Hx     Review of Systems  Constitutional:  Negative.   Genitourinary: Negative.     Exam:   BP 108/77 (BP Location: Right Arm, Patient Position: Sitting, Cuff Size: Normal)   Pulse 81   Ht 5\' 4"  (1.626 m)   Wt 174 lb 6.4 oz (79.1 kg)   LMP 12/08/2002   BMI 29.94 kg/m   Height: 5\' 4"  (162.6 cm)  General appearance: alert, cooperative and appears stated age Breasts: normal appearance, no masses or tenderness Abdomen: soft, non-tender; bowel sounds normal; no masses,  no organomegaly Lymph nodes: Cervical, supraclavicular, and axillary nodes normal.  No abnormal inguinal nodes palpated Neurologic: Grossly normal  Pelvic: External genitalia:  no lesions              Urethra:  normal appearing urethra with no masses, tenderness or lesions              Bartholins and Skenes: normal                 Vagina: normal appearing vagina with atrophic changes and no discharge, no lesions              Cervix: no lesions              Pap taken: No. Bimanual Exam:  Uterus:  normal size, contour, position, consistency, mobility, non-tender              Adnexa: normal adnexa and no mass, fullness, tenderness               Rectovaginal: Confirms               Anus:  normal sphincter tone, no lesions  Chaperone, Ina Homes, CMA, was present for exam.  Assessment/Plan: 1. Encntr for gyn exam (general) (routine) w/o abn findings - Pap smear [id number].  Not indicated today. - Mammogram 12/29/2022 - Colonoscopy 2020, follow up 5 years  - Bone mineral density 04/08/2022 - lab work done with PCP, Dr. Docia Chuck - vaccines reviewed/updated  2. Postmenopausal - not on HRT

## 2023-11-09 DIAGNOSIS — L720 Epidermal cyst: Secondary | ICD-10-CM | POA: Diagnosis not present

## 2023-11-09 DIAGNOSIS — D225 Melanocytic nevi of trunk: Secondary | ICD-10-CM | POA: Diagnosis not present

## 2023-11-09 DIAGNOSIS — L821 Other seborrheic keratosis: Secondary | ICD-10-CM | POA: Diagnosis not present

## 2023-11-09 DIAGNOSIS — L814 Other melanin hyperpigmentation: Secondary | ICD-10-CM | POA: Diagnosis not present

## 2023-11-09 DIAGNOSIS — Z85828 Personal history of other malignant neoplasm of skin: Secondary | ICD-10-CM | POA: Diagnosis not present

## 2023-11-11 DIAGNOSIS — M545 Low back pain, unspecified: Secondary | ICD-10-CM | POA: Diagnosis not present

## 2023-11-11 DIAGNOSIS — R269 Unspecified abnormalities of gait and mobility: Secondary | ICD-10-CM | POA: Diagnosis not present

## 2023-11-11 DIAGNOSIS — M79605 Pain in left leg: Secondary | ICD-10-CM | POA: Diagnosis not present

## 2023-11-17 DIAGNOSIS — M25562 Pain in left knee: Secondary | ICD-10-CM | POA: Diagnosis not present

## 2023-11-24 DIAGNOSIS — F900 Attention-deficit hyperactivity disorder, predominantly inattentive type: Secondary | ICD-10-CM | POA: Diagnosis not present

## 2023-11-24 DIAGNOSIS — Z01818 Encounter for other preprocedural examination: Secondary | ICD-10-CM | POA: Diagnosis not present

## 2023-11-24 DIAGNOSIS — Z79899 Other long term (current) drug therapy: Secondary | ICD-10-CM | POA: Diagnosis not present

## 2023-11-24 DIAGNOSIS — R7301 Impaired fasting glucose: Secondary | ICD-10-CM | POA: Diagnosis not present

## 2023-11-24 DIAGNOSIS — F321 Major depressive disorder, single episode, moderate: Secondary | ICD-10-CM | POA: Diagnosis not present

## 2023-11-24 DIAGNOSIS — E039 Hypothyroidism, unspecified: Secondary | ICD-10-CM | POA: Diagnosis not present

## 2023-11-24 DIAGNOSIS — G4700 Insomnia, unspecified: Secondary | ICD-10-CM | POA: Diagnosis not present

## 2023-12-22 ENCOUNTER — Encounter (HOSPITAL_COMMUNITY): Admission: RE | Admit: 2023-12-22 | Payer: PPO | Source: Ambulatory Visit

## 2024-01-04 ENCOUNTER — Ambulatory Visit (HOSPITAL_COMMUNITY): Admit: 2024-01-04 | Payer: PPO | Admitting: Orthopedic Surgery

## 2024-01-04 SURGERY — ARTHROPLASTY, KNEE, TOTAL
Anesthesia: Spinal | Site: Knee | Laterality: Left

## 2024-01-15 ENCOUNTER — Other Ambulatory Visit: Payer: Self-pay | Admitting: Family Medicine

## 2024-01-15 DIAGNOSIS — Z0000 Encounter for general adult medical examination without abnormal findings: Secondary | ICD-10-CM

## 2024-01-21 DIAGNOSIS — Z860100 Personal history of colon polyps, unspecified: Secondary | ICD-10-CM | POA: Diagnosis not present

## 2024-01-21 DIAGNOSIS — D120 Benign neoplasm of cecum: Secondary | ICD-10-CM | POA: Diagnosis not present

## 2024-01-21 DIAGNOSIS — D123 Benign neoplasm of transverse colon: Secondary | ICD-10-CM | POA: Diagnosis not present

## 2024-01-21 DIAGNOSIS — D122 Benign neoplasm of ascending colon: Secondary | ICD-10-CM | POA: Diagnosis not present

## 2024-02-02 ENCOUNTER — Ambulatory Visit
Admission: RE | Admit: 2024-02-02 | Discharge: 2024-02-02 | Disposition: A | Discharge status: Home / Self Care | Payer: PPO | Source: Ambulatory Visit | Attending: Family Medicine | Admitting: Family Medicine

## 2024-02-02 ENCOUNTER — Ambulatory Visit: Payer: PPO

## 2024-02-02 DIAGNOSIS — Z0000 Encounter for general adult medical examination without abnormal findings: Secondary | ICD-10-CM

## 2024-04-08 NOTE — Progress Notes (Signed)
 Surgery orders requested via Epic inbox.

## 2024-04-12 ENCOUNTER — Ambulatory Visit: Payer: Self-pay | Admitting: Emergency Medicine

## 2024-04-12 DIAGNOSIS — G8929 Other chronic pain: Secondary | ICD-10-CM

## 2024-04-12 DIAGNOSIS — M25562 Pain in left knee: Secondary | ICD-10-CM | POA: Diagnosis not present

## 2024-04-12 NOTE — H&P (Signed)
 TOTAL KNEE ADMISSION H&P  Patient is being admitted for left total knee arthroplasty.  Subjective:  Chief Complaint:left knee pain.  HPI: Sarah James, 73 y.o. female, has a history of pain and functional disability in the left knee due to arthritis and has failed non-surgical conservative treatments for greater than 12 weeks to includeNSAID's and/or analgesics, corticosteriod injections, supervised PT with diminished ADL's post treatment, and activity modification.  Onset of symptoms was gradual, starting 10 years ago with gradually worsening course since that time. The patient noted no past surgery on the left knee(s).  Patient currently rates pain in the left knee(s) at 8 out of 10 with activity. Patient has night pain, worsening of pain with activity and weight bearing, pain that interferes with activities of daily living, and pain with passive range of motion.  Patient has evidence of periarticular osteophytes and joint space narrowing by imaging studies.   There is no active infection.  Patient Active Problem List   Diagnosis Date Noted   S/P total right hip arthroplasty 12/10/2021   Pain in right knee 12/11/2017   Obese 10/21/2017   S/P right TKA 10/20/2017   ADD (attention deficit disorder) 07/27/2017   Insomnia 07/27/2017   Depression, major, single episode, in partial remission (HCC) 07/27/2017   Hypothyroidism 07/27/2017   S/P left THA, AA 09/30/2016   Past Medical History:  Diagnosis Date   ADD (attention deficit disorder)    Anemia    hx of    Anxiety    Arthritis    Depression    GERD (gastroesophageal reflux disease)    Heartburn    Hypothyroidism     Past Surgical History:  Procedure Laterality Date   deviated septum repair     KNEE ARTHROSCOPY Right 2011   left knee arthroscopy      TOTAL HIP ARTHROPLASTY Left 09/30/2016   Procedure: LEFT TOTAL HIP ARTHROPLASTY ANTERIOR APPROACH;  Surgeon: Claiborne Crew, MD;  Location: [facility];  Service: Orthopedics;   Laterality: Left;   TOTAL HIP ARTHROPLASTY Right 12/10/2021   Procedure: TOTAL HIP ARTHROPLASTY ANTERIOR APPROACH;  Surgeon: Claiborne Crew, MD;  Location: [facility];  Service: Orthopedics;  Laterality: Right;   TOTAL KNEE ARTHROPLASTY Right 10/20/2017   Procedure: RIGHT TOTAL KNEE ARTHROPLASTY;  Surgeon: Claiborne Crew, MD;  Location: [facility];  Service: Orthopedics;  Laterality: Right;   VARICOSE VEIN SURGERY      Current Outpatient Medications  Medication Sig Dispense Refill Last Dose/Taking   amphetamine -dextroamphetamine  (ADDERALL  XR) 30 MG 24 hr capsule 1 capsule in the morning Orally Once a day 30 days 30 capsule 0    Ascorbic Acid (VITAMIN C) 1000 MG tablet Take 1,000 mg by mouth daily.      b complex vitamins capsule Take 1 capsule by mouth daily.      buPROPion  (WELLBUTRIN  XL) 150 MG 24 hr tablet Take 1 tablet (150 mg total) by mouth daily. 90 tablet 0    calcium carbonate (OS-CAL) 600 MG TABS tablet Take 600 mg by mouth daily with breakfast.      Carboxymethylcellul-Glycerin (LUBRICATING EYE DROPS OP) Apply 1 drop to eye at bedtime as needed (dry eyes).      Cholecalciferol (VITAMIN D PO) Take 2,000 Units by mouth daily.      Desvenlafaxine  Succinate ER 25 MG TB24 Take 25 mg by mouth daily.      levothyroxine  (SYNTHROID , LEVOTHROID) 88 MCG tablet Take 88 mcg by mouth daily before breakfast.      MAGNESIUM  PO  Take 1 tablet by mouth daily.      Multiple Vitamin (MULTIVITAMIN) capsule Take 1 capsule by mouth daily.      omeprazole  (PRILOSEC  OTC) 20 MG tablet Take 20 mg by mouth daily as needed (acid reflux).      polyethylene glycol (MIRALAX  / GLYCOLAX ) packet Take 17 g 2 (two) times daily by mouth. (Patient taking differently: Take 17 g by mouth daily as needed for mild constipation.) 14 each 0    Probiotic Product (PROBIOTIC-10 ULTIMATE PO) Take 1 tablet by mouth daily.      traZODone  (DESYREL ) 100 MG tablet Take 1 tablet (100 mg total) by mouth at bedtime as needed. 90 tablet 1    Turmeric  500 MG CAPS Take 500 mg by mouth daily.      zinc gluconate 50 MG tablet Take 50 mg by mouth daily.      No current facility-administered medications for this visit.   Allergies  Allergen Reactions   Metformin Nausea And Vomiting    Social History   Tobacco Use   Smoking status: Former    Current packs/day: 0.00    Types: Cigarettes    Quit date: 12/08/1990    Years since quitting: 33.3   Smokeless tobacco: Never  Substance Use Topics   Alcohol use: Yes    Alcohol/week: 0.0 standard drinks of alcohol    Comment: occasional     Family History  Problem Relation Age of Onset   Arthritis Mother    Hypertension Mother    Lung cancer Father    Breast cancer Paternal Aunt    Stroke Maternal Grandmother    Hypertension Maternal Grandmother    Heart disease Paternal Grandfather      Review of Systems  Musculoskeletal:  Positive for arthralgias.  All other systems reviewed and are negative.   Objective:  Physical Exam Constitutional:      General: She is not in acute distress.    Appearance: Normal appearance. She is not ill-appearing.  HENT:     Head: Normocephalic and atraumatic.     Right Ear: External ear normal.     Left Ear: External ear normal.     Nose: Nose normal.     Mouth/Throat:     Mouth: Mucous membranes are moist.     Pharynx: Oropharynx is clear.  Eyes:     Extraocular Movements: Extraocular movements intact.     Conjunctiva/sclera: Conjunctivae normal.  Cardiovascular:     Rate and Rhythm: Normal rate and regular rhythm.     Pulses: Normal pulses.     Heart sounds: Normal heart sounds.  Pulmonary:     Effort: Pulmonary effort is normal.     Breath sounds: Normal breath sounds.  Abdominal:     General: Bowel sounds are normal.     Palpations: Abdomen is soft.     Tenderness: There is no abdominal tenderness.  Musculoskeletal:        General: Tenderness present.     Cervical back: Normal range of motion and neck supple.     Comments: TTP over  medial and lateral joint line, medial worse than lateral.  No calf tenderness, swelling, or erythema.  No overlying lesions of area of chief complaint.  Decreased strength and ROM due to elicited pain.  Pre-operative ROM 0-120.  Dorsiflexion and plantarflexion intact.  Stable to varus and valgus stress.  BLE appear grossly neurovascularly intact.  Gait mildly antalgic.   Skin:    General: Skin is warm and  dry.  Neurological:     Mental Status: She is alert and oriented to person, place, and time. Mental status is at baseline.  Psychiatric:        Mood and Affect: Mood normal.        Behavior: Behavior normal.     Vital signs in last 24 hours: @VSRANGES @  Labs:   Estimated body mass index is 29.94 kg/m as calculated from the following:   Height as of 10/21/23: 5\' 4"  (1.626 m).   Weight as of 10/21/23: 79.1 kg.   Imaging Review Plain radiographs demonstrate severe degenerative joint disease of the left knee(s). The overall alignment issignificant varus. The bone quality appears to be fair for age and reported activity level.      Assessment/Plan:  End stage arthritis, left knee   The patient history, physical examination, clinical judgment of the provider and imaging studies are consistent with end stage degenerative joint disease of the left knee(s) and total knee arthroplasty is deemed medically necessary. The treatment options including medical management, injection therapy arthroscopy and arthroplasty were discussed at length. The risks and benefits of total knee arthroplasty were presented and reviewed. The risks due to aseptic loosening, infection, stiffness, patella tracking problems, thromboembolic complications and other imponderables were discussed. The patient acknowledged the explanation, agreed to proceed with the plan and consent was signed. Patient is being admitted for inpatient treatment for surgery, pain control, PT, OT, prophylactic antibiotics, VTE prophylaxis,  progressive ambulation and ADL's and discharge planning. The patient is planning to be discharged home with outpatient PT.    Anticipated LOS equal to or greater than 2 midnights due to - Age 72 and older with one or more of the following:  - Obesity  - Expected need for hospital services (PT, OT, Nursing) required for safe  discharge  - Anticipated need for postoperative skilled nursing care or inpatient rehab  - Active co-morbidities: insomnia, depression, hypothyroidism, geriatric, ADHD, varicose veins OR   - Unanticipated findings during/Post Surgery: None  - Patient is a high risk of re-admission due to: None

## 2024-04-12 NOTE — H&P (View-Only) (Signed)
 TOTAL KNEE ADMISSION H&P  Patient is being admitted for left total knee arthroplasty.  Subjective:  Chief Complaint:left knee pain.  HPI: Sarah James, 73 y.o. female, has a history of pain and functional disability in the left knee due to arthritis and has failed non-surgical conservative treatments for greater than 12 weeks to includeNSAID's and/or analgesics, corticosteriod injections, supervised PT with diminished ADL's post treatment, and activity modification.  Onset of symptoms was gradual, starting 10 years ago with gradually worsening course since that time. The patient noted no past surgery on the left knee(s).  Patient currently rates pain in the left knee(s) at 8 out of 10 with activity. Patient has night pain, worsening of pain with activity and weight bearing, pain that interferes with activities of daily living, and pain with passive range of motion.  Patient has evidence of periarticular osteophytes and joint space narrowing by imaging studies.   There is no active infection.  Patient Active Problem List   Diagnosis Date Noted   S/P total right hip arthroplasty 12/10/2021   Pain in right knee 12/11/2017   Obese 10/21/2017   S/P right TKA 10/20/2017   ADD (attention deficit disorder) 07/27/2017   Insomnia 07/27/2017   Depression, major, single episode, in partial remission (HCC) 07/27/2017   Hypothyroidism 07/27/2017   S/P left THA, AA 09/30/2016   Past Medical History:  Diagnosis Date   ADD (attention deficit disorder)    Anemia    hx of    Anxiety    Arthritis    Depression    GERD (gastroesophageal reflux disease)    Heartburn    Hypothyroidism     Past Surgical History:  Procedure Laterality Date   deviated septum repair     KNEE ARTHROSCOPY Right 2011   left knee arthroscopy      TOTAL HIP ARTHROPLASTY Left 09/30/2016   Procedure: LEFT TOTAL HIP ARTHROPLASTY ANTERIOR APPROACH;  Surgeon: Claiborne Crew, MD;  Location: [facility];  Service: Orthopedics;   Laterality: Left;   TOTAL HIP ARTHROPLASTY Right 12/10/2021   Procedure: TOTAL HIP ARTHROPLASTY ANTERIOR APPROACH;  Surgeon: Claiborne Crew, MD;  Location: [facility];  Service: Orthopedics;  Laterality: Right;   TOTAL KNEE ARTHROPLASTY Right 10/20/2017   Procedure: RIGHT TOTAL KNEE ARTHROPLASTY;  Surgeon: Claiborne Crew, MD;  Location: [facility];  Service: Orthopedics;  Laterality: Right;   VARICOSE VEIN SURGERY      Current Outpatient Medications  Medication Sig Dispense Refill Last Dose/Taking   amphetamine -dextroamphetamine  (ADDERALL  XR) 30 MG 24 hr capsule 1 capsule in the morning Orally Once a day 30 days 30 capsule 0    Ascorbic Acid (VITAMIN C) 1000 MG tablet Take 1,000 mg by mouth daily.      b complex vitamins capsule Take 1 capsule by mouth daily.      buPROPion  (WELLBUTRIN  XL) 150 MG 24 hr tablet Take 1 tablet (150 mg total) by mouth daily. 90 tablet 0    calcium carbonate (OS-CAL) 600 MG TABS tablet Take 600 mg by mouth daily with breakfast.      Carboxymethylcellul-Glycerin (LUBRICATING EYE DROPS OP) Apply 1 drop to eye at bedtime as needed (dry eyes).      Cholecalciferol (VITAMIN D PO) Take 2,000 Units by mouth daily.      Desvenlafaxine  Succinate ER 25 MG TB24 Take 25 mg by mouth daily.      levothyroxine  (SYNTHROID , LEVOTHROID) 88 MCG tablet Take 88 mcg by mouth daily before breakfast.      MAGNESIUM  PO  Take 1 tablet by mouth daily.      Multiple Vitamin (MULTIVITAMIN) capsule Take 1 capsule by mouth daily.      omeprazole  (PRILOSEC  OTC) 20 MG tablet Take 20 mg by mouth daily as needed (acid reflux).      polyethylene glycol (MIRALAX  / GLYCOLAX ) packet Take 17 g 2 (two) times daily by mouth. (Patient taking differently: Take 17 g by mouth daily as needed for mild constipation.) 14 each 0    Probiotic Product (PROBIOTIC-10 ULTIMATE PO) Take 1 tablet by mouth daily.      traZODone  (DESYREL ) 100 MG tablet Take 1 tablet (100 mg total) by mouth at bedtime as needed. 90 tablet 1    Turmeric  500 MG CAPS Take 500 mg by mouth daily.      zinc gluconate 50 MG tablet Take 50 mg by mouth daily.      No current facility-administered medications for this visit.   Allergies  Allergen Reactions   Metformin Nausea And Vomiting    Social History   Tobacco Use   Smoking status: Former    Current packs/day: 0.00    Types: Cigarettes    Quit date: 12/08/1990    Years since quitting: 33.3   Smokeless tobacco: Never  Substance Use Topics   Alcohol use: Yes    Alcohol/week: 0.0 standard drinks of alcohol    Comment: occasional     Family History  Problem Relation Age of Onset   Arthritis Mother    Hypertension Mother    Lung cancer Father    Breast cancer Paternal Aunt    Stroke Maternal Grandmother    Hypertension Maternal Grandmother    Heart disease Paternal Grandfather      Review of Systems  Musculoskeletal:  Positive for arthralgias.  All other systems reviewed and are negative.   Objective:  Physical Exam Constitutional:      General: She is not in acute distress.    Appearance: Normal appearance. She is not ill-appearing.  HENT:     Head: Normocephalic and atraumatic.     Right Ear: External ear normal.     Left Ear: External ear normal.     Nose: Nose normal.     Mouth/Throat:     Mouth: Mucous membranes are moist.     Pharynx: Oropharynx is clear.  Eyes:     Extraocular Movements: Extraocular movements intact.     Conjunctiva/sclera: Conjunctivae normal.  Cardiovascular:     Rate and Rhythm: Normal rate and regular rhythm.     Pulses: Normal pulses.     Heart sounds: Normal heart sounds.  Pulmonary:     Effort: Pulmonary effort is normal.     Breath sounds: Normal breath sounds.  Abdominal:     General: Bowel sounds are normal.     Palpations: Abdomen is soft.     Tenderness: There is no abdominal tenderness.  Musculoskeletal:        General: Tenderness present.     Cervical back: Normal range of motion and neck supple.     Comments: TTP over  medial and lateral joint line, medial worse than lateral.  No calf tenderness, swelling, or erythema.  No overlying lesions of area of chief complaint.  Decreased strength and ROM due to elicited pain.  Pre-operative ROM 0-120.  Dorsiflexion and plantarflexion intact.  Stable to varus and valgus stress.  BLE appear grossly neurovascularly intact.  Gait mildly antalgic.   Skin:    General: Skin is warm and  dry.  Neurological:     Mental Status: She is alert and oriented to person, place, and time. Mental status is at baseline.  Psychiatric:        Mood and Affect: Mood normal.        Behavior: Behavior normal.     Vital signs in last 24 hours: @VSRANGES @  Labs:   Estimated body mass index is 29.94 kg/m as calculated from the following:   Height as of 10/21/23: 5\' 4"  (1.626 m).   Weight as of 10/21/23: 79.1 kg.   Imaging Review Plain radiographs demonstrate severe degenerative joint disease of the left knee(s). The overall alignment issignificant varus. The bone quality appears to be fair for age and reported activity level.      Assessment/Plan:  End stage arthritis, left knee   The patient history, physical examination, clinical judgment of the provider and imaging studies are consistent with end stage degenerative joint disease of the left knee(s) and total knee arthroplasty is deemed medically necessary. The treatment options including medical management, injection therapy arthroscopy and arthroplasty were discussed at length. The risks and benefits of total knee arthroplasty were presented and reviewed. The risks due to aseptic loosening, infection, stiffness, patella tracking problems, thromboembolic complications and other imponderables were discussed. The patient acknowledged the explanation, agreed to proceed with the plan and consent was signed. Patient is being admitted for inpatient treatment for surgery, pain control, PT, OT, prophylactic antibiotics, VTE prophylaxis,  progressive ambulation and ADL's and discharge planning. The patient is planning to be discharged home with outpatient PT.    Anticipated LOS equal to or greater than 2 midnights due to - Age 72 and older with one or more of the following:  - Obesity  - Expected need for hospital services (PT, OT, Nursing) required for safe  discharge  - Anticipated need for postoperative skilled nursing care or inpatient rehab  - Active co-morbidities: insomnia, depression, hypothyroidism, geriatric, ADHD, varicose veins OR   - Unanticipated findings during/Post Surgery: None  - Patient is a high risk of re-admission due to: None

## 2024-04-13 NOTE — Care Plan (Signed)
 Ortho Bundle Case Management Note  Patient Details  Name: Sarah James MRN: [id number] Date of Birth: February 22, 1951  met with patient in the office for H&P. will discharge to home with family to assist. has DME at home. OPPT set up with [facility]. discharge instructions discussed and questions answered. Patient and MD in agreement. Choice offered.                 DME Arranged:    DME Agency:     HH Arranged:    HH Agency:     Additional Comments: Please contact me with any questions of if this plan should need to change.  Cornelia Dieter,  RN,BSN,MHA,CCM  [facility]opaedic [facility]ist  [phone] 04/13/2024, 10:51 AM

## 2024-04-13 NOTE — Progress Notes (Signed)
 COVID Vaccine Completed: yes  Date of COVID positive in last 90 days:  PCP - Dibas Milta Alosa, MD Cardiologist -   Chest x-ray -  EKG -  Stress Test -  ECHO -  Cardiac Cath -  Pacemaker/ICD device last checked: Spinal Cord Stimulator:  Bowel Prep -   Sleep Study -  CPAP -   Fasting Blood Sugar -  Checks Blood Sugar _____ times a day  Last dose of GLP1 agonist-  N/A GLP1 instructions:  Hold 7 days before surgery    Last dose of SGLT-2 inhibitors-  N/A SGLT-2 instructions:  Hold 3 days before surgery    Blood Thinner Instructions:  Last dose:   Time: Aspirin  Instructions: Last Dose:  Activity level:  Can go up a flight of stairs and perform activities of daily living without stopping and without symptoms of chest pain or shortness of breath.  Able to exercise without symptoms  Unable to go up a flight of stairs without symptoms of     Anesthesia review:   Patient denies shortness of breath, fever, cough and chest pain at PAT appointment  Patient verbalized understanding of instructions that were given to them at the PAT appointment. Patient was also instructed that they will need to review over the PAT instructions again at home before surgery.

## 2024-04-13 NOTE — Patient Instructions (Signed)
 SURGICAL WAITING ROOM VISITATION  Patients having surgery or a procedure may have no more than 2 support people in the waiting area - these visitors may rotate.    Children under the age of 38 must have an adult with them who is not the patient.  Due to an increase in RSV and influenza rates and associated hospitalizations, children ages 108 and under may not visit patients in [facility] hospitals.  Visitors with respiratory illnesses are discouraged from visiting and should remain at home.  If the patient needs to stay at the hospital during part of their recovery, the visitor guidelines for inpatient rooms apply. Pre-op nurse will coordinate an appropriate time for 1 support person to accompany patient in pre-op.  This support person may not rotate.    Please refer to the [facility] website for the visitor guidelines for Inpatients (after your surgery is over and you are in a regular room).    Your procedure is scheduled on: 04/27/24   Report to [facility] Main Entrance    Report to admitting at 6:00 AM   Call this number if you have problems the morning of surgery [phone]   Do not eat food :After Midnight.   After Midnight you may have the following liquids until 5:30 AM DAY OF SURGERY  Water  Non-Citrus Juices (without pulp, NO RED-Apple, White grape, White cranberry) Black Coffee (NO MILK/CREAM OR CREAMERS, sugar ok)  Clear Tea (NO MILK/CREAM OR CREAMERS, sugar ok) regular and decaf                             Plain Jell-O (NO RED)                                           Fruit ices (not with fruit pulp, NO RED)                                     Popsicles (NO RED)                                                               Sports drinks like Gatorade (NO RED)                 The day of surgery:  Drink ONE (1) Pre-Surgery Clear Ensure at 5:30 AM the morning of surgery. Drink in one sitting. Do not sip.  This drink was given to you during your hospital   pre-op appointment visit. Nothing else to drink after completing the  Pre-Surgery Clear Ensure .          If you have questions, please contact your surgeon's office.   FOLLOW BOWEL PREP AND ANY ADDITIONAL PRE OP INSTRUCTIONS YOU RECEIVED FROM YOUR SURGEON'S OFFICE!!!     Oral Hygiene is also important to reduce your risk of infection.                                    Remember -  BRUSH YOUR TEETH THE MORNING OF SURGERY WITH YOUR REGULAR TOOTHPASTE  DENTURES WILL BE REMOVED PRIOR TO SURGERY PLEASE DO NOT APPLY "Poly grip" OR ADHESIVES!!!   Stop all vitamins and herbal supplements 7 days before surgery.   Take these medicines the morning of surgery with A SIP OF WATER : Bupropion , Levothyroxine , Omeprazole    DO NOT TAKE ANY ORAL DIABETIC MEDICATIONS DAY OF YOUR SURGERY  Bring CPAP mask and tubing day of surgery.                              You may not have any metal on your body including hair pins, jewelry, and body piercing             Do not wear make-up, lotions, powders, perfumes, or deodorant  Do not wear nail polish including gel and S&S, artificial/acrylic nails, or any other type of covering on natural nails including finger and toenails. If you have artificial nails, gel coating, etc. that needs to be removed by a nail salon please have this removed prior to surgery or surgery may need to be canceled/ delayed if the surgeon/ anesthesia feels like they are unable to be safely monitored.   Do not shave  48 hours prior to surgery.    Do not bring valuables to the hospital. [facility] IS NOT             RESPONSIBLE   FOR VALUABLES.   Contacts, glasses, dentures or bridgework may not be worn into surgery.   Bring small overnight bag day of surgery.   DO NOT BRING YOUR HOME MEDICATIONS TO THE HOSPITAL. PHARMACY WILL DISPENSE MEDICATIONS LISTED ON YOUR MEDICATION LIST TO YOU DURING YOUR ADMISSION IN THE HOSPITAL!   Special Instructions: Bring a copy of your healthcare  power of attorney and living will documents the day of surgery if you haven't scanned them before.              Please read over the following fact sheets you were given: IF YOU HAVE QUESTIONS ABOUT YOUR PRE-OP INSTRUCTIONS PLEASE CALL [phone] Party    If you received a COVID test during your pre-op visit  it is requested that you wear a mask when out in public, stay away from anyone that may not be feeling well and notify your surgeon if you develop symptoms. If you test positive for Covid or have been in contact with anyone that has tested positive in the last 10 days please notify you surgeon.      Pre-operative 5 CHG Bath Instructions   You can play a key role in reducing the risk of infection after surgery. Your skin needs to be as free of germs as possible. You can reduce the number of germs on your skin by washing with CHG (chlorhexidine  gluconate) soap before surgery. CHG is an antiseptic soap that kills germs and continues to kill germs even after washing.   DO NOT use if you have an allergy to chlorhexidine /CHG or antibacterial soaps. If your skin becomes reddened or irritated, stop using the CHG and notify one of our RNs at [phone].   Please shower with the CHG soap starting 4 days before surgery using the following schedule:     Please keep in mind the following:  DO NOT shave, including legs and underarms, starting the day of your first shower.   You may shave your face at any point before/day of surgery.  Place clean sheets on your bed the day you start using CHG soap. Use a clean washcloth (not used since being washed) for each shower. DO NOT sleep with pets once you start using the CHG.   CHG Shower Instructions:  If you choose to wash your hair and private area, wash first with your normal shampoo/soap.  After you use shampoo/soap, rinse your hair and body thoroughly to remove shampoo/soap residue.  Turn the water  OFF and apply about 3 tablespoons (45 ml) of  CHG soap to a CLEAN washcloth.  Apply CHG soap ONLY FROM YOUR NECK DOWN TO YOUR TOES (washing for 3-5 minutes)  DO NOT use CHG soap on face, private areas, open wounds, or sores.  Pay special attention to the area where your surgery is being performed.  If you are having back surgery, having someone wash your back for you may be helpful. Wait 2 minutes after CHG soap is applied, then you may rinse off the CHG soap.  Pat dry with a clean towel  Put on clean clothes/pajamas   If you choose to wear lotion, please use ONLY the CHG-compatible lotions on the back of this paper.     Additional instructions for the day of surgery: DO NOT APPLY any lotions, deodorants, cologne, or perfumes.   Put on clean/comfortable clothes.  Brush your teeth.  Ask your nurse before applying any prescription medications to the skin.      CHG Compatible Lotions   Aveeno Moisturizing lotion  Cetaphil Moisturizing Cream  Cetaphil Moisturizing Lotion  Clairol Herbal Essence Moisturizing Lotion, Dry Skin  Clairol Herbal Essence Moisturizing Lotion, Extra Dry Skin  Clairol Herbal Essence Moisturizing Lotion, Normal Skin  Curel Age Defying Therapeutic Moisturizing Lotion with Alpha Hydroxy  Curel Extreme Care Body Lotion  Curel Soothing Hands Moisturizing Hand Lotion  Curel Therapeutic Moisturizing Cream, Fragrance-Free  Curel Therapeutic Moisturizing Lotion, Fragrance-Free  Curel Therapeutic Moisturizing Lotion, Original Formula  Eucerin Daily Replenishing Lotion  Eucerin Dry Skin Therapy Plus Alpha Hydroxy Crme  Eucerin Dry Skin Therapy Plus Alpha Hydroxy Lotion  Eucerin Original Crme  Eucerin Original Lotion  Eucerin Plus Crme Eucerin Plus Lotion  Eucerin TriLipid Replenishing Lotion  Keri Anti-Bacterial Hand Lotion  Keri Deep Conditioning Original Lotion Dry Skin Formula Softly Scented  Keri Deep Conditioning Original Lotion, Fragrance Free Sensitive Skin Formula  Keri Lotion Fast Absorbing  Fragrance Free Sensitive Skin Formula  Keri Lotion Fast Absorbing Softly Scented Dry Skin Formula  Keri Original Lotion  Keri Skin Renewal Lotion Keri Silky Smooth Lotion  Keri Silky Smooth Sensitive Skin Lotion  Nivea Body Creamy Conditioning Oil  Nivea Body Extra Enriched Teacher, adult education Moisturizing Lotion Nivea Crme  Nivea Skin Firming Lotion  NutraDerm 30 Skin Lotion  NutraDerm Skin Lotion  NutraDerm Therapeutic Skin Cream  NutraDerm Therapeutic Skin Lotion  ProShield Protective Hand Cream  Provon moisturizing lotion  WHAT IS A BLOOD TRANSFUSION? Blood Transfusion Information  A transfusion is the replacement of blood or some of its parts. Blood is made up of multiple cells which provide different functions. Red blood cells carry oxygen and are used for blood loss replacement. White blood cells fight against infection. Platelets control bleeding. Plasma helps clot blood. Other blood products are available for specialized needs, such as hemophilia or other clotting disorders. BEFORE THE TRANSFUSION  Who gives blood for transfusions?  Healthy volunteers who are fully evaluated to make sure their blood is safe. This is blood bank  blood. Transfusion therapy is the safest it has ever been in the practice of medicine. Before blood is taken from a donor, a complete history is taken to make sure that person has no history of diseases nor engages in risky social behavior (examples are intravenous drug use or sexual activity with multiple partners). The donor's travel history is screened to minimize risk of transmitting infections, such as malaria. The donated blood is tested for signs of infectious diseases, such as HIV and hepatitis. The blood is then tested to be sure it is compatible with you in order to minimize the chance of a transfusion reaction. If you or a relative donates blood, this is often done in anticipation of surgery and is not appropriate  for emergency situations. It takes many days to process the donated blood. RISKS AND COMPLICATIONS Although transfusion therapy is very safe and saves many lives, the main dangers of transfusion include:  Getting an infectious disease. Developing a transfusion reaction. This is an allergic reaction to something in the blood you were given. Every precaution is taken to prevent this. The decision to have a blood transfusion has been considered carefully by your caregiver before blood is given. Blood is not given unless the benefits outweigh the risks. AFTER THE TRANSFUSION Right after receiving a blood transfusion, you will usually feel much better and more energetic. This is especially true if your red blood cells have gotten low (anemic). The transfusion raises the level of the red blood cells which carry oxygen, and this usually causes an energy increase. The nurse administering the transfusion will monitor you carefully for complications. HOME CARE INSTRUCTIONS  No special instructions are needed after a transfusion. You may find your energy is better. Speak with your caregiver about any limitations on activity for underlying diseases you may have. SEEK MEDICAL CARE IF:  Your condition is not improving after your transfusion. You develop redness or irritation at the intravenous (IV) site. SEEK IMMEDIATE MEDICAL CARE IF:  Any of the following symptoms occur over the next 12 hours: Shaking chills. You have a temperature by mouth above 102 F (38.9 C), not controlled by medicine. Chest, back, or muscle pain. People around you feel you are not acting correctly or are confused. Shortness of breath or difficulty breathing. Dizziness and fainting. You get a rash or develop hives. You have a decrease in urine output. Your urine turns a dark color or changes to pink, red, or brown. Any of the following symptoms occur over the next 10 days: You have a temperature by mouth above 102 F (38.9 C),  not controlled by medicine. Shortness of breath. Weakness after normal activity. The white part of the eye turns yellow (jaundice). You have a decrease in the amount of urine or are urinating less often. Your urine turns a dark color or changes to pink, red, or brown. Document Released: 11/21/2000 Document Revised: 02/16/2012 Document Reviewed: 07/10/2008 [employer] Patient Information 2014 [place], [place].  _______________________________________________________________________  Incentive Spirometer  An incentive spirometer is a tool that can help keep your lungs clear and active. This tool measures how well you are filling your lungs with each breath. Taking long deep breaths may help reverse or decrease the chance of developing breathing (pulmonary) problems (especially infection) following: A long period of time when you are unable to move or be active. BEFORE THE PROCEDURE  If the spirometer includes an indicator to show your best effort, your nurse or respiratory therapist will set it to a desired goal.  If possible, sit up straight or lean slightly forward. Try not to slouch. Hold the incentive spirometer in an upright position. INSTRUCTIONS FOR USE  Sit on the edge of your bed if possible, or sit up as far as you can in bed or on a chair. Hold the incentive spirometer in an upright position. Breathe out normally. Place the mouthpiece in your mouth and seal your lips tightly around it. Breathe in slowly and as deeply as possible, raising the piston or the ball toward the top of the column. Hold your breath for 3-5 seconds or for as long as possible. Allow the piston or ball to fall to the bottom of the column. Remove the mouthpiece from your mouth and breathe out normally. Rest for a few seconds and repeat Steps 1 through 7 at least 10 times every 1-2 hours when you are awake. Take your time and take a few normal breaths between deep breaths. The spirometer may include an indicator to  show your best effort. Use the indicator as a goal to work toward during each repetition. After each set of 10 deep breaths, practice coughing to be sure your lungs are clear. If you have an incision (the cut made at the time of surgery), support your incision when coughing by placing a pillow or rolled up towels firmly against it. Once you are able to get out of bed, walk around indoors and cough well. You may stop using the incentive spirometer when instructed by your caregiver.  RISKS AND COMPLICATIONS Take your time so you do not get dizzy or light-headed. If you are in pain, you may need to take or ask for pain medication before doing incentive spirometry. It is harder to take a deep breath if you are having pain. AFTER USE Rest and breathe slowly and easily. It can be helpful to keep track of a log of your progress. Your caregiver can provide you with a simple table to help with this. If you are using the spirometer at home, follow these instructions: SEEK MEDICAL CARE IF:  You are having difficultly using the spirometer. You have trouble using the spirometer as often as instructed. Your pain medication is not giving enough relief while using the spirometer. You develop fever of 100.5 F (38.1 C) or higher. SEEK IMMEDIATE MEDICAL CARE IF:  You cough up bloody sputum that had not been present before. You develop fever of 102 F (38.9 C) or greater. You develop worsening pain at or near the incision site. MAKE SURE YOU:  Understand these instructions. Will watch your condition. Will get help right away if you are not doing well or get worse. Document Released: 04/06/2007 Document Revised: 02/16/2012 Document Reviewed: 06/07/2007 [facility] Patient Information 2014 [place], [place].   ________________________________________________________________________

## 2024-04-14 ENCOUNTER — Other Ambulatory Visit: Payer: Self-pay

## 2024-04-14 ENCOUNTER — Encounter (HOSPITAL_COMMUNITY): Payer: Self-pay

## 2024-04-14 ENCOUNTER — Encounter (HOSPITAL_COMMUNITY)
Admission: RE | Admit: 2024-04-14 | Discharge: 2024-04-14 | Disposition: A | Discharge status: Home / Self Care | Source: Ambulatory Visit | Attending: Orthopedic Surgery | Admitting: Orthopedic Surgery

## 2024-04-14 VITALS — BP 106/68 | HR 82 | Resp 14 | Ht 64.0 in | Wt 162.8 lb

## 2024-04-14 DIAGNOSIS — Z01812 Encounter for preprocedural laboratory examination: Secondary | ICD-10-CM | POA: Insufficient documentation

## 2024-04-14 DIAGNOSIS — M25562 Pain in left knee: Secondary | ICD-10-CM | POA: Insufficient documentation

## 2024-04-14 DIAGNOSIS — G8929 Other chronic pain: Secondary | ICD-10-CM | POA: Diagnosis not present

## 2024-04-14 DIAGNOSIS — Z01818 Encounter for other preprocedural examination: Secondary | ICD-10-CM

## 2024-04-14 HISTORY — DX: Malignant (primary) neoplasm, unspecified: C80.1

## 2024-04-14 HISTORY — DX: Other complications of anesthesia, initial encounter: T88.59XA

## 2024-04-14 LAB — COMPREHENSIVE METABOLIC PANEL WITH GFR
ALT: 29 U/L (ref 0–44)
AST: 25 U/L (ref 15–41)
Albumin: 4 g/dL (ref 3.5–5.0)
Alkaline Phosphatase: 50 U/L (ref 38–126)
Anion gap: 8 (ref 5–15)
BUN: 19 mg/dL (ref 8–23)
CO2: 26 mmol/L (ref 22–32)
Calcium: 9.9 mg/dL (ref 8.9–10.3)
Chloride: 102 mmol/L (ref 98–111)
Creatinine, Ser: 0.75 mg/dL (ref 0.44–1.00)
GFR, Estimated: >60 mL/min (ref >60)
Glucose, Bld: 88 mg/dL (ref 70–99)
Potassium: 4.1 mmol/L (ref 3.5–5.1)
Sodium: 136 mmol/L (ref 135–145)
Total Bilirubin: 0.5 mg/dL (ref 0.0–1.2)
Total Protein: 7.1 g/dL (ref 6.5–8.1)

## 2024-04-14 LAB — CBC WITH DIFFERENTIAL/PLATELET
Abs Immature Granulocytes: 0.02 10*3/uL (ref 0.00–0.07)
Basophils Absolute: 0 10*3/uL (ref 0.0–0.1)
Basophils Relative: 1 %
Eosinophils Absolute: 0.1 10*3/uL (ref 0.0–0.5)
Eosinophils Relative: 2 %
HCT: 38.6 % (ref 36.0–46.0)
Hemoglobin: 12.3 g/dL (ref 12.0–15.0)
Immature Granulocytes: 0 %
Lymphocytes Relative: 23 %
Lymphs Abs: 1.2 10*3/uL (ref 0.7–4.0)
MCH: 29.4 pg (ref 26.0–34.0)
MCHC: 31.9 g/dL (ref 30.0–36.0)
MCV: 92.3 fL (ref 80.0–100.0)
Monocytes Absolute: 0.5 10*3/uL (ref 0.1–1.0)
Monocytes Relative: 10 %
Neutro Abs: 3.4 10*3/uL (ref 1.7–7.7)
Neutrophils Relative %: 64 %
Platelets: 293 10*3/uL (ref 150–400)
RBC: 4.18 MIL/uL (ref 3.87–5.11)
RDW: 13.5 % (ref 11.5–15.5)
WBC: 5.2 10*3/uL (ref 4.0–10.5)
nRBC: 0 % (ref 0.0–0.2)

## 2024-04-14 LAB — TYPE AND SCREEN
ABO/RH(D): A NEG
Antibody Screen: NEGATIVE

## 2024-04-14 LAB — SURGICAL PCR SCREEN
MRSA, PCR: NEGATIVE
Staphylococcus aureus: NEGATIVE

## 2024-04-21 DIAGNOSIS — R7301 Impaired fasting glucose: Secondary | ICD-10-CM | POA: Diagnosis not present

## 2024-04-21 DIAGNOSIS — Z79899 Other long term (current) drug therapy: Secondary | ICD-10-CM | POA: Diagnosis not present

## 2024-04-21 DIAGNOSIS — Z0001 Encounter for general adult medical examination with abnormal findings: Secondary | ICD-10-CM | POA: Diagnosis not present

## 2024-04-21 DIAGNOSIS — E039 Hypothyroidism, unspecified: Secondary | ICD-10-CM | POA: Diagnosis not present

## 2024-04-21 DIAGNOSIS — F900 Attention-deficit hyperactivity disorder, predominantly inattentive type: Secondary | ICD-10-CM | POA: Diagnosis not present

## 2024-04-21 DIAGNOSIS — F321 Major depressive disorder, single episode, moderate: Secondary | ICD-10-CM | POA: Diagnosis not present

## 2024-04-21 DIAGNOSIS — G4700 Insomnia, unspecified: Secondary | ICD-10-CM | POA: Diagnosis not present

## 2024-04-27 ENCOUNTER — Observation Stay (HOSPITAL_COMMUNITY)

## 2024-04-27 ENCOUNTER — Ambulatory Visit (HOSPITAL_COMMUNITY): Admitting: Anesthesiology

## 2024-04-27 ENCOUNTER — Other Ambulatory Visit: Payer: Self-pay

## 2024-04-27 ENCOUNTER — Encounter (HOSPITAL_COMMUNITY)
Admission: RE | Disposition: A | Discharge status: Home / Self Care | Payer: Self-pay | Source: Ambulatory Visit | Attending: Orthopedic Surgery

## 2024-04-27 ENCOUNTER — Observation Stay (HOSPITAL_COMMUNITY)
Admission: RE | Admit: 2024-04-27 | Discharge: 2024-04-28 | Disposition: A | Discharge status: Home / Self Care | Payer: PPO | Source: Ambulatory Visit | Attending: Orthopedic Surgery | Admitting: Orthopedic Surgery

## 2024-04-27 ENCOUNTER — Ambulatory Visit (HOSPITAL_BASED_OUTPATIENT_CLINIC_OR_DEPARTMENT_OTHER): Admitting: Anesthesiology

## 2024-04-27 ENCOUNTER — Encounter (HOSPITAL_COMMUNITY): Payer: Self-pay | Admitting: Orthopedic Surgery

## 2024-04-27 DIAGNOSIS — M1712 Unilateral primary osteoarthritis, left knee: Secondary | ICD-10-CM

## 2024-04-27 DIAGNOSIS — Z96643 Presence of artificial hip joint, bilateral: Secondary | ICD-10-CM | POA: Diagnosis not present

## 2024-04-27 DIAGNOSIS — G8918 Other acute postprocedural pain: Secondary | ICD-10-CM | POA: Diagnosis not present

## 2024-04-27 DIAGNOSIS — Z87891 Personal history of nicotine dependence: Secondary | ICD-10-CM | POA: Diagnosis not present

## 2024-04-27 DIAGNOSIS — Z85828 Personal history of other malignant neoplasm of skin: Secondary | ICD-10-CM | POA: Diagnosis not present

## 2024-04-27 DIAGNOSIS — Z96651 Presence of right artificial knee joint: Secondary | ICD-10-CM | POA: Insufficient documentation

## 2024-04-27 DIAGNOSIS — Z96652 Presence of left artificial knee joint: Secondary | ICD-10-CM | POA: Diagnosis not present

## 2024-04-27 DIAGNOSIS — Z79899 Other long term (current) drug therapy: Secondary | ICD-10-CM | POA: Diagnosis not present

## 2024-04-27 DIAGNOSIS — E039 Hypothyroidism, unspecified: Secondary | ICD-10-CM | POA: Insufficient documentation

## 2024-04-27 DIAGNOSIS — Z471 Aftercare following joint replacement surgery: Secondary | ICD-10-CM | POA: Diagnosis not present

## 2024-04-27 HISTORY — PX: TOTAL KNEE ARTHROPLASTY: SHX125

## 2024-04-27 SURGERY — ARTHROPLASTY, KNEE, TOTAL
Anesthesia: Monitor Anesthesia Care | Site: Knee | Laterality: Left

## 2024-04-27 MED ORDER — METHOCARBAMOL 500 MG PO TABS
500.0000 mg | ORAL_TABLET | Freq: Four times a day (QID) | ORAL | Status: DC | PRN
  Administered 2024-04-27: 500 mg via ORAL
  Filled 2024-04-27: qty 1

## 2024-04-27 MED ORDER — FENTANYL CITRATE PF 50 MCG/ML IJ SOSY: 25.0000 ug | PREFILLED_SYRINGE | INTRAMUSCULAR | Status: DC | PRN

## 2024-04-27 MED ORDER — SODIUM CHLORIDE 0.9 % IR SOLN
Status: DC | PRN
  Administered 2024-04-27: 3000 mL

## 2024-04-27 MED ORDER — MENTHOL 3 MG MT LOZG: 1.0000 | LOZENGE | OROMUCOSAL | Status: DC | PRN

## 2024-04-27 MED ORDER — TRANEXAMIC ACID-NACL 1000-0.7 MG/100ML-% IV SOLN
1000.0000 mg | INTRAVENOUS | Status: AC
  Administered 2024-04-27: 1000 mg via INTRAVENOUS
  Filled 2024-04-27: qty 100

## 2024-04-27 MED ORDER — POLYVINYL ALCOHOL 1.4 % OP SOLN: 1.0000 [drp] | Freq: Every evening | OPHTHALMIC | Status: DC | PRN

## 2024-04-27 MED ORDER — BUPROPION HCL ER (XL) 150 MG PO TB24
150.0000 mg | ORAL_TABLET | Freq: Every day | ORAL | Status: DC
  Administered 2024-04-28: 150 mg via ORAL
  Filled 2024-04-27: qty 1

## 2024-04-27 MED ORDER — ORAL CARE MOUTH RINSE: 15.0000 mL | Freq: Once | OROMUCOSAL | Status: AC

## 2024-04-27 MED ORDER — OXYCODONE HCL 5 MG/5ML PO SOLN: 5.0000 mg | Freq: Once | ORAL | Status: DC | PRN

## 2024-04-27 MED ORDER — ACETAMINOPHEN 325 MG PO TABS: 325.0000 mg | ORAL_TABLET | Freq: Four times a day (QID) | ORAL | Status: DC | PRN

## 2024-04-27 MED ORDER — HYDROMORPHONE HCL 1 MG/ML IJ SOLN: 0.5000 mg | INTRAMUSCULAR | Status: DC | PRN

## 2024-04-27 MED ORDER — ACETAMINOPHEN 500 MG PO TABS
1000.0000 mg | ORAL_TABLET | Freq: Four times a day (QID) | ORAL | Status: AC
  Administered 2024-04-27 – 2024-04-28 (×4): 1000 mg via ORAL
  Filled 2024-04-27 (×4): qty 2

## 2024-04-27 MED ORDER — AMPHETAMINE-DEXTROAMPHET ER 10 MG PO CP24
30.0000 mg | ORAL_CAPSULE | Freq: Every day | ORAL | Status: DC
  Administered 2024-04-28: 30 mg via ORAL
  Filled 2024-04-27: qty 3

## 2024-04-27 MED ORDER — BUPIVACAINE IN DEXTROSE 0.75-8.25 % IT SOLN
INTRATHECAL | Status: DC | PRN
Start: 2024-04-27 — End: 2024-04-27
  Administered 2024-04-27: 1.6 mL via INTRATHECAL

## 2024-04-27 MED ORDER — ONDANSETRON HCL 4 MG PO TABS: 4.0000 mg | ORAL_TABLET | Freq: Four times a day (QID) | ORAL | Status: DC | PRN

## 2024-04-27 MED ORDER — DIPHENHYDRAMINE HCL 12.5 MG/5ML PO ELIX: 12.5000 mg | ORAL_SOLUTION | ORAL | Status: DC | PRN

## 2024-04-27 MED ORDER — DOCUSATE SODIUM 100 MG PO CAPS
100.0000 mg | ORAL_CAPSULE | Freq: Two times a day (BID) | ORAL | Status: DC
  Administered 2024-04-27 – 2024-04-28 (×2): 100 mg via ORAL
  Filled 2024-04-27 (×2): qty 1

## 2024-04-27 MED ORDER — BUPIVACAINE-EPINEPHRINE (PF) 0.25% -1:200000 IJ SOLN
INTRAMUSCULAR | Status: AC
  Filled 2024-04-27: qty 30

## 2024-04-27 MED ORDER — LACTATED RINGERS IV SOLN: INTRAVENOUS | Status: DC

## 2024-04-27 MED ORDER — FENTANYL CITRATE (PF) 100 MCG/2ML IJ SOLN
INTRAMUSCULAR | Status: DC | PRN
  Administered 2024-04-27: 50 ug via INTRAVENOUS

## 2024-04-27 MED ORDER — METHOCARBAMOL 1000 MG/10ML IJ SOLN: 500.0000 mg | Freq: Four times a day (QID) | INTRAMUSCULAR | Status: DC | PRN

## 2024-04-27 MED ORDER — ACETAMINOPHEN 500 MG PO TABS
1000.0000 mg | ORAL_TABLET | Freq: Once | ORAL | Status: AC
  Administered 2024-04-27: 1000 mg via ORAL
  Filled 2024-04-27: qty 2

## 2024-04-27 MED ORDER — PROPOFOL 10 MG/ML IV BOLUS
INTRAVENOUS | Status: DC | PRN
  Administered 2024-04-27: 20 mg via INTRAVENOUS

## 2024-04-27 MED ORDER — CELECOXIB 100 MG PO CAPS
100.0000 mg | ORAL_CAPSULE | Freq: Two times a day (BID) | ORAL | 0 refills | Status: AC
Start: 2024-04-27 — End: 2024-05-11

## 2024-04-27 MED ORDER — ONDANSETRON HCL 4 MG/2ML IJ SOLN: 4.0000 mg | Freq: Four times a day (QID) | INTRAMUSCULAR | Status: DC | PRN

## 2024-04-27 MED ORDER — ISOPROPYL ALCOHOL 70 % SOLN
Status: AC
  Filled 2024-04-27: qty 480

## 2024-04-27 MED ORDER — SODIUM CHLORIDE 0.9 % IV SOLN: INTRAVENOUS | Status: DC

## 2024-04-27 MED ORDER — SODIUM CHLORIDE (PF) 0.9 % IJ SOLN
INTRAMUSCULAR | Status: DC | PRN
  Administered 2024-04-27: 80 mL

## 2024-04-27 MED ORDER — PANTOPRAZOLE SODIUM 40 MG PO TBEC
40.0000 mg | DELAYED_RELEASE_TABLET | Freq: Every day | ORAL | Status: DC
  Administered 2024-04-28: 40 mg via ORAL
  Filled 2024-04-27: qty 1

## 2024-04-27 MED ORDER — OXYCODONE HCL 5 MG PO TABS: 5.0000 mg | ORAL_TABLET | Freq: Once | ORAL | Status: DC | PRN

## 2024-04-27 MED ORDER — POLYETHYLENE GLYCOL 3350 17 G PO PACK: 17.0000 g | PACK | Freq: Every day | ORAL | Status: DC | PRN

## 2024-04-27 MED ORDER — KETOROLAC TROMETHAMINE 15 MG/ML IJ SOLN
7.5000 mg | Freq: Four times a day (QID) | INTRAMUSCULAR | Status: AC
  Administered 2024-04-27 – 2024-04-28 (×4): 7.5 mg via INTRAVENOUS
  Filled 2024-04-27 (×4): qty 1

## 2024-04-27 MED ORDER — CEFAZOLIN SODIUM-DEXTROSE 2-4 GM/100ML-% IV SOLN
2.0000 g | Freq: Four times a day (QID) | INTRAVENOUS | Status: AC
  Administered 2024-04-27 (×2): 2 g via INTRAVENOUS
  Filled 2024-04-27 (×2): qty 100

## 2024-04-27 MED ORDER — DEXAMETHASONE SODIUM PHOSPHATE 10 MG/ML IJ SOLN
8.0000 mg | Freq: Once | INTRAMUSCULAR | Status: AC
  Administered 2024-04-27: 8 mg via INTRAVENOUS

## 2024-04-27 MED ORDER — ONDANSETRON HCL 4 MG/2ML IJ SOLN
INTRAMUSCULAR | Status: DC | PRN
  Administered 2024-04-27: 4 mg via INTRAVENOUS

## 2024-04-27 MED ORDER — ONDANSETRON HCL 4 MG PO TABS: 4.0000 mg | ORAL_TABLET | Freq: Three times a day (TID) | ORAL | 0 refills | Status: AC | PRN

## 2024-04-27 MED ORDER — CEFAZOLIN SODIUM-DEXTROSE 2-4 GM/100ML-% IV SOLN
2.0000 g | INTRAVENOUS | Status: AC
  Administered 2024-04-27: 2 g via INTRAVENOUS
  Filled 2024-04-27: qty 100

## 2024-04-27 MED ORDER — PHENOL 1.4 % MT LIQD: 1.0000 | OROMUCOSAL | Status: DC | PRN

## 2024-04-27 MED ORDER — TRAZODONE HCL 100 MG PO TABS
100.0000 mg | ORAL_TABLET | Freq: Every evening | ORAL | Status: DC | PRN
  Administered 2024-04-27: 100 mg via ORAL
  Filled 2024-04-27: qty 1

## 2024-04-27 MED ORDER — PROPOFOL 1000 MG/100ML IV EMUL
INTRAVENOUS | Status: AC
  Filled 2024-04-27: qty 100

## 2024-04-27 MED ORDER — CHLORHEXIDINE GLUCONATE 0.12 % MT SOLN
15.0000 mL | Freq: Once | OROMUCOSAL | Status: AC
  Administered 2024-04-27: 15 mL via OROMUCOSAL

## 2024-04-27 MED ORDER — ASPIRIN 81 MG PO CHEW
81.0000 mg | CHEWABLE_TABLET | Freq: Two times a day (BID) | ORAL | Status: DC
Start: 2024-04-27 — End: 2024-04-28
  Administered 2024-04-27 – 2024-04-28 (×2): 81 mg via ORAL
  Filled 2024-04-27 (×2): qty 1

## 2024-04-27 MED ORDER — WATER FOR IRRIGATION, STERILE IR SOLN
Status: DC | PRN
  Administered 2024-04-27: 1000 mL

## 2024-04-27 MED ORDER — PROPOFOL 500 MG/50ML IV EMUL
INTRAVENOUS | Status: DC | PRN
  Administered 2024-04-27: 50 ug/kg/min via INTRAVENOUS

## 2024-04-27 MED ORDER — ACETAMINOPHEN 500 MG PO TABS: 1000.0000 mg | ORAL_TABLET | Freq: Three times a day (TID) | ORAL | Status: AC | PRN

## 2024-04-27 MED ORDER — 0.9 % SODIUM CHLORIDE (POUR BTL) OPTIME
TOPICAL | Status: DC | PRN
Start: 2024-04-27 — End: 2024-04-27
  Administered 2024-04-27: 1000 mL

## 2024-04-27 MED ORDER — ROPIVACAINE HCL 5 MG/ML IJ SOLN
INTRAMUSCULAR | Status: DC | PRN
  Administered 2024-04-27: 25 mL via PERINEURAL

## 2024-04-27 MED ORDER — LEVOTHYROXINE SODIUM 88 MCG PO TABS
88.0000 ug | ORAL_TABLET | Freq: Every day | ORAL | Status: DC
  Administered 2024-04-28: 88 ug via ORAL
  Filled 2024-04-27: qty 1

## 2024-04-27 MED ORDER — FENTANYL CITRATE (PF) 100 MCG/2ML IJ SOLN
INTRAMUSCULAR | Status: AC
  Filled 2024-04-27: qty 2

## 2024-04-27 MED ORDER — OXYCODONE HCL 5 MG PO TABS: 5.0000 mg | ORAL_TABLET | ORAL | 0 refills | Status: AC | PRN

## 2024-04-27 MED ORDER — PHENYLEPHRINE HCL-NACL 20-0.9 MG/250ML-% IV SOLN
INTRAVENOUS | Status: DC | PRN
  Administered 2024-04-27: 50 ug/min via INTRAVENOUS

## 2024-04-27 MED ORDER — POVIDONE-IODINE 10 % EX SWAB
2.0000 | Freq: Once | CUTANEOUS | Status: AC
  Administered 2024-04-27: 2 via TOPICAL

## 2024-04-27 MED ORDER — ISOPROPYL ALCOHOL 70 % SOLN
Status: DC | PRN
  Administered 2024-04-27: 1 via TOPICAL

## 2024-04-27 MED ORDER — ASPIRIN 81 MG PO TBEC
81.0000 mg | DELAYED_RELEASE_TABLET | Freq: Two times a day (BID) | ORAL | Status: AC
Start: 2024-04-28 — End: 2024-05-26

## 2024-04-27 MED ORDER — OXYCODONE HCL 5 MG PO TABS
5.0000 mg | ORAL_TABLET | ORAL | Status: DC | PRN
  Administered 2024-04-27 – 2024-04-28 (×3): 10 mg via ORAL
  Filled 2024-04-27 (×4): qty 2

## 2024-04-27 MED ORDER — VENLAFAXINE HCL ER 37.5 MG PO CP24
37.5000 mg | ORAL_CAPSULE | Freq: Every day | ORAL | Status: DC
  Administered 2024-04-28: 37.5 mg via ORAL
  Filled 2024-04-27: qty 1

## 2024-04-27 MED ORDER — BUPIVACAINE LIPOSOME 1.3 % IJ SUSP
INTRAMUSCULAR | Status: AC
  Filled 2024-04-27: qty 20

## 2024-04-27 MED ORDER — METHOCARBAMOL 500 MG PO TABS: 500.0000 mg | ORAL_TABLET | Freq: Three times a day (TID) | ORAL | 0 refills | Status: AC | PRN

## 2024-04-27 MED ORDER — SODIUM CHLORIDE (PF) 0.9 % IJ SOLN
INTRAMUSCULAR | Status: AC
  Filled 2024-04-27: qty 30

## 2024-04-27 MED ORDER — BUPIVACAINE LIPOSOME 1.3 % IJ SUSP: 20.0000 mL | Freq: Once | INTRAMUSCULAR | Status: DC

## 2024-04-27 SURGICAL SUPPLY — 52 items
BAG COUNTER SPONGE SURGICOUNT (BAG) IMPLANT
BLADE SAG 18X100X1.27 (BLADE) ×1 IMPLANT
BLADE SAW SAG 35X64 .89 (BLADE) ×1 IMPLANT
BLADE SAW SGTL 11.0X1.19X90.0M (BLADE) IMPLANT
BNDG COHESIVE 3X5 TAN ST LF (GAUZE/BANDAGES/DRESSINGS) ×1 IMPLANT
BNDG ELASTIC 6X10 VLCR STRL LF (GAUZE/BANDAGES/DRESSINGS) ×1 IMPLANT
BOWL SMART MIX CTS (DISPOSABLE) IMPLANT
CEMENT BONE R 1X40 (Cement) IMPLANT
CHLORAPREP W/TINT 26 (MISCELLANEOUS) ×2 IMPLANT
COMPONENT FEM CEMT PRNSA SZ9LT (Knees) IMPLANT
COVER SURGICAL LIGHT HANDLE (MISCELLANEOUS) ×1 IMPLANT
CUFF TRNQT CYL 34X4.125X (TOURNIQUET CUFF) ×1 IMPLANT
DERMABOND ADVANCED .7 DNX12 (GAUZE/BANDAGES/DRESSINGS) ×1 IMPLANT
DRAPE INCISE IOBAN 85X60 (DRAPES) ×1 IMPLANT
DRAPE SHEET LG 3/4 BI-LAMINATE (DRAPES) ×1 IMPLANT
DRAPE U-SHAPE 47X51 STRL (DRAPES) ×1 IMPLANT
DRSG AQUACEL AG ADV 3.5X10 (GAUZE/BANDAGES/DRESSINGS) ×1 IMPLANT
ELECT REM PT RETURN 15FT ADLT (MISCELLANEOUS) ×1 IMPLANT
GAUZE SPONGE 4X4 12PLY STRL (GAUZE/BANDAGES/DRESSINGS) ×1 IMPLANT
GLOVE BIO SURGEON STRL SZ 6.5 (GLOVE) ×2 IMPLANT
GLOVE BIOGEL PI IND STRL 6.5 (GLOVE) ×1 IMPLANT
GLOVE BIOGEL PI IND STRL 8 (GLOVE) ×1 IMPLANT
GLOVE SURG ORTHO 8.0 STRL STRW (GLOVE) ×2 IMPLANT
GOWN STRL REUS W/ TWL XL LVL3 (GOWN DISPOSABLE) ×2 IMPLANT
HOLDER FOLEY CATH W/STRAP (MISCELLANEOUS) ×1 IMPLANT
HOOD PEEL AWAY T7 (MISCELLANEOUS) ×3 IMPLANT
INSERT MED AS PERS SZ 8-11 LT (Insert) IMPLANT
KIT TURNOVER KIT A (KITS) ×1 IMPLANT
MANIFOLD NEPTUNE II (INSTRUMENTS) ×1 IMPLANT
MARKER SKIN DUAL TIP RULER LAB (MISCELLANEOUS) ×1 IMPLANT
NS IRRIG 1000ML POUR BTL (IV SOLUTION) ×1 IMPLANT
PACK TOTAL KNEE CUSTOM (KITS) ×1 IMPLANT
PENCIL SMOKE EVACUATOR (MISCELLANEOUS) ×1 IMPLANT
PIN DRILL HDLS TROCAR 75 4PK (PIN) IMPLANT
SCREW HEADED 33MM KNEE (MISCELLANEOUS) IMPLANT
SET HNDPC FAN SPRY TIP SCT (DISPOSABLE) ×1 IMPLANT
SOLUTION IRRIG SURGIPHOR (IV SOLUTION) IMPLANT
SOLUTION PRONTOSAN WOUND 350ML (IRRIGATION / IRRIGATOR) IMPLANT
STEM POLY PAT PLY 32M KNEE (Knees) IMPLANT
STEM TIBIA 5 DEG SZ F L KNEE (Knees) IMPLANT
STRIP CLOSURE SKIN 1/2X4 (GAUZE/BANDAGES/DRESSINGS) ×1 IMPLANT
SUT MNCRL AB 3-0 PS2 18 (SUTURE) ×1 IMPLANT
SUT STRATAFIX 14 PDO 48 VLT (SUTURE) ×1 IMPLANT
SUT STRATAFIX PDO 1 14 VIOLET (SUTURE) ×1 IMPLANT
SUT VIC AB 2-0 CT2 27 (SUTURE) ×2 IMPLANT
SUT VICRYL+ 3-0 36IN CT-1 (SUTURE) IMPLANT
SUTURE STRATFX 0 PDS 27 VIOLET (SUTURE) ×1 IMPLANT
SYR 50ML LL SCALE MARK (SYRINGE) ×1 IMPLANT
TRAY FOLEY MTR SLVR 14FR STAT (SET/KITS/TRAYS/PACK) IMPLANT
TUBE SUCTION HIGH CAP CLEAR NV (SUCTIONS) ×1 IMPLANT
UNDERPAD 30X36 HEAVY ABSORB (UNDERPADS AND DIAPERS) ×1 IMPLANT
WRAP KNEE MAXI GEL POST OP (GAUZE/BANDAGES/DRESSINGS) ×1 IMPLANT

## 2024-04-27 NOTE — Transfer of Care (Signed)
 Immediate Anesthesia Transfer of Care Note  Patient: Sarah James  Procedure(s) Performed: ARTHROPLASTY, KNEE, TOTAL (Left: Knee)  Patient Location: PACU  Anesthesia Type:MAC and Spinal  Level of Consciousness: awake and patient cooperative  Airway & Oxygen Therapy: Patient Spontanous Breathing and Patient connected to face mask oxygen  Post-op Assessment: Report given to RN and Post -op Vital signs reviewed and stable  Post vital signs: Reviewed and stable  Last Vitals:  Vitals Value Taken Time  BP 100/49 04/27/24 1047  Temp    Pulse 71 04/27/24 1048  Resp 18 04/27/24 1048  SpO2 97 % 04/27/24 1048  Vitals shown include unfiled device data.  Last Pain:  Vitals:   04/27/24 0708  TempSrc:   PainSc: 0-No pain         Complications: No notable events documented.

## 2024-04-27 NOTE — Interval H&P Note (Signed)

## 2024-04-27 NOTE — Anesthesia Procedure Notes (Signed)
 Spinal  Start time: 04/27/2024 8:20 AM End time: 04/27/2024 8:22 AM Reason for block: surgical anesthesia Staffing Performed: resident/CRNA  Anesthesiologist: Ellena Gurney, MD Resident/CRNA: Micky Albee, CRNA Performed by: Micky Albee, CRNA Authorized by: Ellena Gurney, MD   Preanesthetic Checklist Completed: patient identified, IV checked, site marked, risks and benefits discussed, surgical consent, monitors and equipment checked, pre-op evaluation and timeout performed Spinal Block Patient position: sitting Prep: DuraPrep Patient monitoring: heart rate, cardiac monitor, continuous pulse ox and blood pressure Location: L3-4 Injection technique: single-shot Needle Needle type: Pencan  Needle gauge: 24 G Needle length: 10 cm Needle insertion depth: 7 cm Assessment Sensory level: T6 Events: CSF return Additional Notes Timeout performed. Patient in sitting position. L3-4 identified. Cleansed with Duraprep. SAB without difficulty. To supine position.

## 2024-04-27 NOTE — Anesthesia Preprocedure Evaluation (Signed)
 Anesthesia Evaluation  Patient identified by MRN, date of birth, ID band Patient awake    Reviewed: Allergy & Precautions, H&P , NPO status , Patient's Chart, lab work & pertinent test results  Airway Mallampati: II   Neck ROM: full    Dental   Pulmonary former smoker   breath sounds clear to auscultation       Cardiovascular negative cardio ROS  Rhythm:regular Rate:Normal     Neuro/Psych  PSYCHIATRIC DISORDERS Anxiety Depression       GI/Hepatic ,GERD  ,,  Endo/Other  Hypothyroidism    Renal/GU      Musculoskeletal  (+) Arthritis ,    Abdominal   Peds  Hematology   Anesthesia Other Findings   Reproductive/Obstetrics                             Anesthesia Physical Anesthesia Plan  ASA: 2  Anesthesia Plan: Spinal and MAC   Post-op Pain Management: Regional block*   Induction: Intravenous  PONV Risk Score and Plan: 2 and Propofol  infusion and Treatment may vary due to age or medical condition  Airway Management Planned: Simple Face Mask  Additional Equipment:   Intra-op Plan:   Post-operative Plan:   Informed Consent: I have reviewed the patients History and Physical, chart, labs and discussed the procedure including the risks, benefits and alternatives for the proposed anesthesia with the patient or authorized representative who has indicated his/her understanding and acceptance.     Dental advisory given  Plan Discussed with: CRNA, Anesthesiologist and Surgeon  Anesthesia Plan Comments:        Anesthesia Quick Evaluation

## 2024-04-27 NOTE — Op Note (Signed)
 DATE OF SURGERY:  04/27/2024 TIME: 10:19 AM  PATIENT NAME:  Sarah James   AGE: 73 y.o.    PRE-OPERATIVE DIAGNOSIS: End-stage left knee osteoarthritis  POST-OPERATIVE DIAGNOSIS:  Same  PROCEDURE: Cemented left total Knee Arthroplasty  SURGEON:  Beuford Garcilazo A Elaine Roanhorse, MD   ASSISTANT: Mason Sole, PA-C, present and scrubbed throughout the case, critical for assistance with exposure, retraction, instrumentation, and closure.   OPERATIVE IMPLANTS:  Cemented Zimmer persona size 9 left standard CR femur, left size F tibial baseplate, 32 mm cemented all poly patella, 11 mm MC poly insert Implant Name Type Inv. Item Serial No. Manufacturer Lot No. LRB No. Used Action  CEMENT BONE R 1X40 - [id number] Cement CEMENT BONE R 1X40  ZIMMER [employer]ON(ORTH,TRAU,BIO,SG) [id number] Left 2 Implanted  STEM TIBIA 5 DEG SZ F L KNEE - [id number] Knees STEM TIBIA 5 DEG SZ F L KNEE  ZIMMER [employer]ON(ORTH,TRAU,BIO,SG) [id number] Left 1 Implanted  COMPONENT FEM CEMT PRNSA SZ9LT - [id number] Knees COMPONENT FEM CEMT PRNSA [id number]  ZIMMER [employer]ON(ORTH,TRAU,BIO,SG) [id number] Left 1 Implanted  STEM POLY PAT PLY 2M KNEE - [id number] Knees STEM POLY PAT PLY 2M KNEE  ZIMMER [employer]ON(ORTH,TRAU,BIO,SG) [id number] Left 1 Implanted  INSERT MED AS PERS SZ 8-11 LT - [id number] Insert INSERT MED AS PERS SZ 8-11 LT  ZIMMER [employer]ON(ORTH,TRAU,BIO,SG) [id number] Left 1 Implanted      PREOPERATIVE INDICATIONS:  Sarah James is a 73 y.o. year old female with end stage bone on bone degenerative arthritis of the knee who failed conservative treatment, including injections, antiinflammatories, activity modification, and assistive devices, and had significant impairment of their activities of daily living, and elected for Total Knee Arthroplasty.   The risks, benefits, and alternatives were discussed at length including but not limited to the risks of infection, bleeding, nerve injury, stiffness, blood clots, the need for revision surgery,  cardiopulmonary complications, among others, and they were willing to proceed.  ESTIMATED BLOOD LOSS: 50cc  OPERATIVE DESCRIPTION:   Once adequate anesthesia was induced, preoperative antibiotics, 2 gm of ancef ,1 gm of Tranexamic Acid , and 8 mg of Decadron  administered, the patient was positioned supine with a left thigh tourniquet placed.  The left lower extremity was prepped and draped in sterile fashion.  A time-  out was performed identifying the patient, planned procedure, and the appropriate extremity.     The leg was  exsanguinated, tourniquet elevated to 250 mmHg.  A midline incision was  made followed by median parapatellar arthrotomy. Anterior horn of the medial meniscus was released and resected. A medial release was performed, the infrapatellar fat pad was resected with care taken to protect the patellar tendon. The suprapatellar fat was removed to exposed the distal anterior femur. The anterior horn of the lateral meniscus and ACL were released.    Following initial  exposure, I first started with the femur  The femoral  canal was opened with a drill, canal was suctioned to try to prevent fat emboli.  An  intramedullary rod was passed set at 5 degrees valgus, 10 mm. The distal femur was resected.  Following this resection, the tibia was  subluxated anteriorly.  Using the extramedullary guide, 10 mm of bone was resected off   the proximal lateral tibia.  We confirmed the gap would be  stable medially and laterally with a size 10mm spacer block as well as confirmed that the tibial cut was perpendicular in the coronal plane, checking with an alignment rod.    Once this was done, the posterior femoral  referencing femoral sizer was placed under to the posterior condyles with 3 degrees of external rotational which was parallel to the transepicondylar axis and perpendicular to [employer]. The femur was sized to be a size 9 in the anterior-  posterior dimension. The  anterior, posterior, and   chamfer cuts were made without difficulty nor   notching making certain that I was along the anterior cortex to help  with flexion gap stability. Next a laminar spreader was placed with the knee in flexion and the medial lateral menisci were resected.  5 cc of the Exparel  mixture was injected in the medial side of the back of the knee and 3 cc in the lateral side.  1/2 inch curved osteotome was used to resect posterior osteophyte that was then removed with a pituitary rongeur.       At this point, the tibia was sized to be a size F.  The size F tray was  then pinned in position. Trial reduction was now carried with a 9 femur, F tibia, a 11 mm MC insert.  The knee had full extension and was stable to varus valgus stress in extension.  The knee was slightly tight in flexion and the PCL was partially released.   Attention was next directed to the patella.  Precut  measurement was noted to be 24 mm.  I resected down to 14 mm and used a  32mm patellar button to restore patellar height as well as cover the cut surface.     The patella lug holes were drilled and a 32 mm patella poly trial was placed.    The knee was brought to full extension with good flexion stability with the patella tracking through the trochlea without application of pressure.     Next the femoral component was again assessed and determined to be seated and appropriately lateralized.  The femoral lug holes were drilled.  The femoral component was then removed. Tibial component was again assessed and felt to be seated and appropriately rotated with the medial third of the tubercle. The tibia was then drilled, and keel punched.     Final components were  opened and cement was mixed.      Final implants were then  cemented onto cleaned and dried cut surfaces of bone with the knee brought to extension with a 11 mm MC poly.  The knee was irrigated with sterile Betadine  diluted in saline as well as pulse lavage normal saline. The synovial  lining was  then injected a dilute Exparel  with 30cc of 0.25% marcaine  with epinephrine .         Once the cement had fully cured, excess cement was removed throughout the knee.  I confirmed that I was satisfied with the range of motion and stability, and the final 11mm MC poly insert was chosen.  It was placed into the knee.         The tourniquet had been let down at 60 minutes.  No significant hemostasis was required.  The medial parapatellar arthrotomy was then reapproximated using #1 Stratafix sutures with the knee  in flexion.  The remaining wound was closed with 0 stratafix, 2-0 Vicryl, and running 3-0 Monocryl. The knee was cleaned, dried, dressed sterilely using Dermabond and   Aquacel dressing.  The patient was then brought to recovery room in stable condition, tolerating the procedure  well. There were no complications.   Post op recs: WB: WBAT Abx: ancef  Imaging: PACU xrays DVT prophylaxis: Aspirin  81mg   BID x4 weeks Follow up: 2 weeks after surgery for a wound check with Dr. Pryor Browning at [facility].  Address: [address], [place], [place] [address]  Office Phone: [phone]  Priscille Brought, MD Orthopaedic Surgery

## 2024-04-27 NOTE — Progress Notes (Signed)
 Orthopedic Tech Progress Note Patient Details:  Sarah James 06/06/1951 [id number] Applied bone foam per order.  Ortho Devices Type of Ortho Device: Bone foam zero knee Ortho Device/Splint Location: LLE Ortho Device/Splint Interventions: Ordered, Application, Adjustment   Post Interventions Patient Tolerated: Well Instructions Provided: Adjustment of device, Care of device  Rayna Calkin 04/27/2024, 10:52 AM

## 2024-04-27 NOTE — Discharge Instructions (Signed)

## 2024-04-27 NOTE — Anesthesia Procedure Notes (Signed)
 Anesthesia Regional Block: Adductor canal block   Pre-Anesthetic Checklist: , timeout performed,  Correct Patient, Correct Site, Correct Laterality,  Correct Procedure, Correct Position, site marked,  Risks and benefits discussed,  Surgical consent,  Pre-op evaluation,  At surgeon's request and post-op pain management  Laterality: Left  Prep: chloraprep       Needles:  Injection technique: Single-shot  Needle Type: Echogenic Needle     Needle Length: 9cm  Needle Gauge: 21     Additional Needles:   Narrative:  Start time: 04/27/2024 8:02 AM End time: 04/27/2024 8:12 AM Injection made incrementally with aspirations every 5 mL.  Performed by: Personally  Anesthesiologist: Ellena Gurney, MD  Additional Notes: Pt tolerated the procedure well.

## 2024-04-27 NOTE — Evaluation (Signed)
 Physical Therapy Evaluation Patient Details Name: Sarah James MRN: [id number] DOB: [birth year] Today's Date: 04/27/2024  History of Present Illness  73 yo female presents to therapy s/p L TKA on 04/27/2024 due to failure of conservative measures. Pt PMH includes but is not limited to: R THA (2023), L THA,  R TKA, ADD, depression, anxiety and hypothyroidism.  Clinical Impression    Sarah James is a 73 y.o. female POD 0 s/p L TKA. Patient reports IND with mobility at baseline. Patient is now limited by functional impairments (see PT problem list below) and requires S for bed mobility and CGA and cues for transfers. Patient was able to ambulate 75 feet with RW and CGA level of assist. Patient instructed in exercise to facilitate ROM and circulation to manage edema. Patient will benefit from continued skilled PT interventions to address impairments and progress towards PLOF. Acute PT will follow to progress mobility and stair training in preparation for safe discharge home with family support and OPPT services scheduled for 5/23.       If plan is discharge home, recommend the following: A little help with walking and/or transfers;A little help with bathing/dressing/bathroom;Assistance with cooking/housework;Assist for transportation;Help with stairs or ramp for entrance   Can travel by private vehicle        Equipment Recommendations None recommended by PT  Recommendations for Other Services       Functional Status Assessment Patient has had a recent decline in their functional status and demonstrates the ability to make significant improvements in function in a reasonable and predictable amount of time.     Precautions / Restrictions Precautions Precautions: Knee;Fall Restrictions Weight Bearing Restrictions Per Provider Order: No      Mobility  Bed Mobility Overal bed mobility: Needs Assistance Bed Mobility: Supine to Sit     Supine to sit: Supervision, HOB elevated      General bed mobility comments: min cues    Transfers Overall transfer level: Needs assistance Equipment used: Rolling walker (2 wheels) Transfers: Sit to/from Stand Sit to Stand: Contact guard assist           General transfer comment: min cues pull to stand    Ambulation/Gait Ambulation/Gait assistance: Contact guard assist Gait Distance (Feet): 75 Feet Assistive device: Rolling walker (2 wheels) Gait Pattern/deviations: Step-to pattern, Antalgic, Trunk flexed, Decreased stance time - right Gait velocity: decreased     General Gait Details: min cues, wide BOS, step to pattern with lateral sway and B UE support at RW to offload R LE in stance phase  Stairs            Wheelchair Mobility     Tilt Bed    Modified Rankin (Stroke Patients Only)       Balance Overall balance assessment: Needs assistance Sitting-balance support: Feet supported Sitting balance-Leahy Scale: Good     Standing balance support: Bilateral upper extremity supported, During functional activity, Reliant on assistive device for balance Standing balance-Leahy Scale: Fair Standing balance comment: static standing no UE support                             Pertinent Vitals/Pain Pain Assessment Pain Assessment: 0-10 Pain Score: 4  Pain Location: L knee Pain Descriptors / Indicators: Aching, Constant, Discomfort, Grimacing, Operative site guarding Pain Intervention(s): Limited activity within patient's tolerance, Monitored during session, Premedicated before session, Repositioned, Ice applied    Home Living Family/patient expects to be discharged  to:: Private residence Living Arrangements: Spouse/significant other Available Help at Discharge: Family Type of Home: House Home Access: Stairs to enter Entrance Stairs-Rails: Right Entrance Stairs-Number of Steps: 3   Home Layout: Two level;Able to live on main level with bedroom/bathroom Home Equipment: Tree surgeon (2 wheels);Cane - single point Additional Comments: active playing tennis and pickleball prior to surgery    Prior Function Prior Level of Function : Independent/Modified Independent;Driving             Mobility Comments: IND for all ADLs, self care tasks and IADLs       Extremity/Trunk Assessment        Lower Extremity Assessment Lower Extremity Assessment: LLE deficits/detail LLE Deficits / Details: ankle DF/PF 5/5; SLR < 10 degree lag LLE Sensation: decreased light touch    Cervical / Trunk Assessment Cervical / Trunk Assessment: Normal  Communication   Communication Communication: No apparent difficulties    Cognition Arousal: Alert Behavior During Therapy: WFL for tasks assessed/performed   PT - Cognitive impairments: No apparent impairments                         Following commands: Intact       Cueing       General Comments      Exercises Total Joint Exercises Ankle Circles/Pumps: AROM, Both, 10 reps   Assessment/Plan    PT Assessment Patient needs continued PT services  PT Problem List Decreased strength;Decreased range of motion;Decreased activity tolerance;Decreased balance;Decreased mobility;Decreased coordination;Pain       PT Treatment Interventions DME instruction;Gait training;Stair training;Functional mobility training;Therapeutic activities;Therapeutic exercise;Balance training;Neuromuscular re-education;Patient/family education;Modalities    PT Goals (Current goals can be found in the Care Plan section)  Acute Rehab PT Goals Patient Stated Goal: to be able to return to playing pickleball and tennis as well as walk more normally PT Goal Formulation: With patient Time For Goal Achievement: 05/11/24 Potential to Achieve Goals: Good    Frequency 7X/week     Co-evaluation               AM-PAC PT "6 Clicks" Mobility  Outcome Measure Help needed turning from your back to your side while in a flat bed without  using bedrails?: None Help needed moving from lying on your back to sitting on the side of a flat bed without using bedrails?: A Little Help needed moving to and from a bed to a chair (including a wheelchair)?: A Little Help needed standing up from a chair using your arms (e.g., wheelchair or bedside chair)?: A Little Help needed to walk in hospital room?: A Little Help needed climbing 3-5 steps with a railing? : A Lot 6 Click Score: 18    End of Session Equipment Utilized During Treatment: Gait belt Activity Tolerance: Patient tolerated treatment well;No increased pain Patient left: in chair;with call bell/phone within reach;with chair alarm set Nurse Communication: Mobility status PT Visit Diagnosis: Unsteadiness on feet (R26.81);Other abnormalities of gait and mobility (R26.89);Muscle weakness (generalized) (M62.81);Difficulty in walking, not elsewhere classified (R26.2);Pain Pain - Right/Left: Left Pain - part of body: Knee;Leg    Time: 9629-5284 PT Time Calculation (min) (ACUTE ONLY): 32 min   Charges:   PT Evaluation $PT Eval Low Complexity: 1 Low PT Treatments $Gait Training: 8-22 mins PT General Charges $$ ACUTE PT VISIT: 1 Visit         Cary Clarks, PT Acute Rehab   Annalee Kiang 04/27/2024, 5:41 PM

## 2024-04-28 ENCOUNTER — Encounter (HOSPITAL_COMMUNITY): Payer: Self-pay | Admitting: Orthopedic Surgery

## 2024-04-28 DIAGNOSIS — M1712 Unilateral primary osteoarthritis, left knee: Secondary | ICD-10-CM | POA: Diagnosis not present

## 2024-04-28 LAB — BASIC METABOLIC PANEL WITH GFR
Anion gap: 8 (ref 5–15)
BUN: 14 mg/dL (ref 8–23)
CO2: 25 mmol/L (ref 22–32)
Calcium: 8.9 mg/dL (ref 8.9–10.3)
Chloride: 103 mmol/L (ref 98–111)
Creatinine, Ser: 0.64 mg/dL (ref 0.44–1.00)
GFR, Estimated: >60 mL/min (ref >60)
Glucose, Bld: 112 mg/dL — ABNORMAL HIGH (ref 70–99)
Potassium: 4 mmol/L (ref 3.5–5.1)
Sodium: 136 mmol/L (ref 135–145)

## 2024-04-28 LAB — CBC
HCT: 31.4 % — ABNORMAL LOW (ref 36.0–46.0)
Hemoglobin: 9.9 g/dL — ABNORMAL LOW (ref 12.0–15.0)
MCH: 29.5 pg (ref 26.0–34.0)
MCHC: 31.5 g/dL (ref 30.0–36.0)
MCV: 93.5 fL (ref 80.0–100.0)
Platelets: 242 10*3/uL (ref 150–400)
RBC: 3.36 MIL/uL — ABNORMAL LOW (ref 3.87–5.11)
RDW: 13.2 % (ref 11.5–15.5)
WBC: 12.9 10*3/uL — ABNORMAL HIGH (ref 4.0–10.5)
nRBC: 0 % (ref 0.0–0.2)

## 2024-04-28 NOTE — Discharge Summary (Signed)
 Physician Discharge Summary  Patient ID: Sarah James MRN: [id number] DOB/AGE: 1951-03-24 73 y.o.  Admit date: 04/27/2024 Discharge date: 04/28/2024  Admission Diagnoses:  Primary osteoarthritis of left knee  Discharge Diagnoses:  Principal Problem:   Primary osteoarthritis of left knee   Past Medical History:  Diagnosis Date   ADD (attention deficit disorder)    Anemia    hx of    Anxiety    Arthritis    Cancer (HCC)    skin cancer removed   Complication of anesthesia    BP drop after anesthesia   Depression    GERD (gastroesophageal reflux disease)    Heartburn    Hypothyroidism     Surgeries: Procedure(s): ARTHROPLASTY, KNEE, TOTAL on 04/27/2024   Consultants (if any):   Discharged Condition: Improved  Hospital Course: Sarah James is an 73 y.o. female who was admitted 04/27/2024 with a diagnosis of Primary osteoarthritis of left knee and went to the operating room on 04/27/2024 and underwent the above named procedures.    She was given perioperative antibiotics:  Anti-infectives (From admission, onward)    Start     Dose/Rate Route Frequency Ordered Stop   04/27/24 1430  ceFAZolin  (ANCEF ) IVPB 2g/100 mL premix        2 g 200 mL/hr over 30 Minutes Intravenous Every 6 hours 04/27/24 1219 04/27/24 2117   04/27/24 0645  ceFAZolin  (ANCEF ) IVPB 2g/100 mL premix        2 g 200 mL/hr over 30 Minutes Intravenous On call to O.R. 04/27/24 4098 04/27/24 0829     .  She was given sequential compression devices, early ambulation, and aspirin  for DVT prophylaxis.  She benefited maximally from the hospital stay and there were no complications.    Recent vital signs:  Vitals:   04/28/24 0131 04/28/24 0554  BP: (!) 94/59 120/63  Pulse: 61 60  Resp: 17 17  Temp: 97.9 F (36.6 C) 98.3 F (36.8 C)  SpO2: 96% 95%    Recent laboratory studies:  Lab Results  Component Value Date   HGB 9.9 (L) 04/28/2024   HGB 12.3 04/14/2024   HGB 9.8 (L) 12/11/2021   Lab  Results  Component Value Date   WBC 12.9 (H) 04/28/2024   PLT 242 04/28/2024   No results found for: "INR" Lab Results  Component Value Date   NA 136 04/28/2024   K 4.0 04/28/2024   CL 103 04/28/2024   CO2 25 04/28/2024   BUN 14 04/28/2024   CREATININE 0.64 04/28/2024   GLUCOSE 112 (H) 04/28/2024    Discharge Medications:   Allergies as of 04/28/2024       Reactions   Metformin Nausea And Vomiting        Medication List     TAKE these medications    acetaminophen  500 MG tablet Commonly known as: TYLENOL  Take 2 tablets (1,000 mg total) by mouth every 8 (eight) hours as needed.   amphetamine -dextroamphetamine  30 MG 24 hr capsule Commonly known as: Adderall  XR Take 1 capsule by mouth daily in the morning (1 capsule in the morning Orally Once a day 30 days)   aspirin  EC 81 MG tablet Take 1 tablet (81 mg total) by mouth 2 (two) times daily for 28 days. Swallow whole.   b complex vitamins capsule Take 1 capsule by mouth daily.   buPROPion  150 MG 24 hr tablet Commonly known as: WELLBUTRIN  XL Take 1 tablet (150 mg total) by mouth daily.   calcium carbonate  600 MG Tabs tablet Commonly known as: OS-CAL Take 600 mg by mouth daily with breakfast.   celecoxib  100 MG capsule Commonly known as: CeleBREX  Take 1 capsule (100 mg total) by mouth 2 (two) times daily for 14 days.   desvenlafaxine  25 MG 24 hr tablet Commonly known as: PRISTIQ  Take 25 mg by mouth daily.   levothyroxine  88 MCG tablet Commonly known as: SYNTHROID  Take 88 mcg by mouth daily before breakfast.   LUBRICATING EYE DROPS OP Apply 1 drop to eye at bedtime as needed (dry eyes).   MAGNESIUM  PO Take 1 tablet by mouth daily.   methocarbamol  500 MG tablet Commonly known as: ROBAXIN  Take 1 tablet (500 mg total) by mouth every 8 (eight) hours as needed for up to 10 days for muscle spasms.   multivitamin capsule Take 1 capsule by mouth daily.   omeprazole  20 MG tablet Commonly known as: PRILOSEC   OTC Take 20 mg by mouth daily as needed (acid reflux).   ondansetron  4 MG tablet Commonly known as: Zofran  Take 1 tablet (4 mg total) by mouth every 8 (eight) hours as needed for up to 14 days for nausea or vomiting.   oxyCODONE 5 MG immediate release tablet Commonly known as: Roxicodone Take 1 tablet (5 mg total) by mouth every 4 (four) hours as needed for up to 7 days for severe pain (pain score 7-10) or moderate pain (pain score 4-6).   polyethylene glycol 17 g packet Commonly known as: MIRALAX  / GLYCOLAX  Take 17 g 2 (two) times daily by mouth. What changed:  when to take this reasons to take this   PROBIOTIC-10 ULTIMATE PO Take 1 tablet by mouth daily.   traZODone  100 MG tablet Commonly known as: DESYREL  Take 1 tablet (100 mg total) by mouth at bedtime as needed.   Turmeric 500 MG Caps Take 500 mg by mouth daily.   vitamin C 1000 MG tablet Take 1,000 mg by mouth daily.   VITAMIN D PO Take 2,000 Units by mouth daily.   zinc gluconate 50 MG tablet Take 50 mg by mouth daily.        Diagnostic Studies: DG Knee Left Port Result Date: 04/27/2024 CLINICAL DATA:  Postoperative state. EXAM: PORTABLE LEFT KNEE - 1-2 VIEW COMPARISON:  None Available. FINDINGS: Interval total left knee arthroplasty. No perihardware lucency is seen to indicate hardware failure or loosening. Expected postoperative changes including intra-articular and subcutaneous air. Smalljoint effusion. No acute fracture or dislocation. IMPRESSION: Interval total left knee arthroplasty without evidence of hardware failure. Electronically Signed   By: Bertina Broccoli M.D.   On: 04/27/2024 15:33    Disposition: Discharge disposition: 01-Home or Self Care       Discharge Instructions     Call MD / Call 911   Complete by: As directed    If you experience chest pain or shortness of breath, CALL 911 and be transported to the hospital emergency room.  If you develope a fever above 101 F, pus (white drainage)  or increased drainage or redness at the wound, or calf pain, call your surgeon's office.   Constipation Prevention   Complete by: As directed    Drink plenty of fluids.  Prune juice may be helpful.  You may use a stool softener, such as Colace (over the counter) 100 mg twice a day.  Use MiraLax  (over the counter) for constipation as needed.   Diet - low sodium heart healthy   Complete by: As directed    Do  not put a pillow under the knee. Place it under the heel.   Complete by: As directed    Driving restrictions   Complete by: As directed    No driving for 2-4 weeks   Increase activity slowly as tolerated   Complete by: As directed    Post-operative opioid taper instructions:   Complete by: As directed    POST-OPERATIVE OPIOID TAPER INSTRUCTIONS: It is important to wean off of your opioid medication as soon as possible. If you do not need pain medication after your surgery it is ok to stop day one. Opioids include: Codeine, Hydrocodone (Norco, Vicodin), Oxycodone(Percocet, oxycontin) and hydromorphone  amongst others.  Long term and even short term use of opiods can cause: Increased pain response Dependence Constipation Depression Respiratory depression And more.  Withdrawal symptoms can include Flu like symptoms Nausea, vomiting And more Techniques to manage these symptoms Hydrate well Eat regular healthy meals Stay active Use relaxation techniques(deep breathing, meditating, yoga) Do Not substitute Alcohol to help with tapering If you have been on opioids for less than two weeks and do not have pain than it is ok to stop all together.  Plan to wean off of opioids This plan should start within one week post op of your joint replacement. Maintain the same interval or time between taking each dose and first decrease the dose.  Cut the total daily intake of opioids by one tablet each day Next start to increase the time between doses. The last dose that should be eliminated is  the evening dose.      TED hose   Complete by: As directed    Use stockings (TED hose) for 2 weeks on both leg(s).  Then for 2 more weeks on the surgical leg.  You may remove them at night for sleeping.        Follow-up Information     Murleen Arms, MD. Go on 05/12/2024.   Specialty: Orthopedic Surgery Why: Your appointment is scheduled for 2:45 Contact information: [address] [place] [address] [phone]         Rehabilitation, Avel Leiter. Go on 04/29/2024.   Specialty: Physical Therapy Why: Your outpatient physical therapy has been ordered. they will call you with a time Contact information: [address] [place] [address] [phone]                    Discharge Instructions      INSTRUCTIONS AFTER JOINT REPLACEMENT   Remove items at home which could result in a fall. This includes throw rugs or furniture in walking pathways ICE to the affected joint every three hours while awake for 30 minutes at a time, for at least the first 3-5 days, and then as needed for pain and swelling.  Continue to use ice for pain and swelling. You may notice swelling that will progress down to the foot and ankle.  This is normal after surgery.  Elevate your leg when you are not up walking on it.   Continue to use the breathing machine you got in the hospital (incentive spirometer) which will help keep your temperature down.  It is common for your temperature to cycle up and down following surgery, especially at night when you are not up moving around and exerting yourself.  The breathing machine keeps your lungs expanded and your temperature down.  DIET:  As you were doing prior to hospitalization, we recommend a well-balanced diet.  DRESSING / WOUND CARE /  SHOWERING:  Keep the surgical dressing until follow up.  The dressing is water  proof, so you can shower without any extra covering.  IF THE DRESSING FALLS OFF or the wound gets wet inside, change  the dressing with sterile gauze.  Please use good hand washing techniques before changing the dressing.  Do not use any lotions or creams on the incision until instructed by your surgeon.    ACTIVITY  Increase activity slowly as tolerated, but follow the weight bearing instructions below.   No driving for 6 weeks or until further direction given by your physician.  You cannot drive while taking narcotics.  No lifting or carrying greater than 10 lbs. until further directed by your surgeon. Avoid periods of inactivity such as sitting longer than an hour when not asleep. This helps prevent blood clots.  You may return to work once you are authorized by your doctor.   WEIGHT BEARING: Weight bearing as tolerated with assist device (walker, cane, etc) as directed, use it as long as suggested by your surgeon or therapist, typically at least 4-6 weeks.  EXERCISES  Results after joint replacement surgery are often greatly improved when you follow the exercise, range of motion and muscle strengthening exercises prescribed by your doctor. Safety measures are also important to protect the joint from further injury. Any time any of these exercises cause you to have increased pain or swelling, decrease what you are doing until you are comfortable again and then slowly increase them. If you have problems or questions, call your caregiver or physical therapist for advice.   Rehabilitation is important following a joint replacement. After just a few days of immobilization, the muscles of the leg can become weakened and shrink (atrophy).  These exercises are designed to build up the tone and strength of the thigh and leg muscles and to improve motion. Often times heat used for twenty to thirty minutes before working out will loosen up your tissues and help with improving the range of motion but do not use heat for the first two weeks following surgery (sometimes heat can increase post-operative swelling).   These  exercises can be done on a training (exercise) mat, on the floor, on a table or on a bed. Use whatever works the best and is most comfortable for you.    Use music or television while you are exercising so that the exercises are a pleasant break in your day. This will make your life better with the exercises acting as a break in your routine that you can look forward to.   Perform all exercises about fifteen times, three times per day or as directed.  You should exercise both the operative leg and the other leg as well.  Exercises include:   Quad Sets - Tighten up the muscle on the front of the thigh (Quad) and hold for 5-10 seconds.   Straight Leg Raises - With your knee straight (if you were given a brace, keep it on), lift the leg to 60 degrees, hold for 3 seconds, and slowly lower the leg.  Perform this exercise against resistance later as your leg gets stronger.  Leg Slides: Lying on your back, slowly slide your foot toward your buttocks, bending your knee up off the floor (only go as far as is comfortable). Then slowly slide your foot back down until your leg is flat on the floor again.  Angel Wings: Lying on your back spread your legs to the side as far apart as  you can without causing discomfort.  Hamstring Strength:  Lying on your back, push your heel against the floor with your leg straight by tightening up the muscles of your buttocks.  Repeat, but this time bend your knee to a comfortable angle, and push your heel against the floor.  You may put a pillow under the heel to make it more comfortable if necessary.   A rehabilitation program following joint replacement surgery can speed recovery and prevent re-injury in the future due to weakened muscles. Contact your doctor or a physical therapist for more information on knee rehabilitation.   CONSTIPATION:  Constipation is defined medically as fewer than three stools per week and severe constipation as less than one stool per week.  Even if you  have a regular bowel pattern at home, your normal regimen is likely to be disrupted due to multiple reasons following surgery.  Combination of anesthesia, postoperative narcotics, change in appetite and fluid intake all can affect your bowels.   YOU MUST use at least one of the following options; they are listed in order of increasing strength to get the job done.  They are all available over the counter, and you may need to use some, POSSIBLY even all of these options:    Drink plenty of fluids (prune juice may be helpful) and high fiber foods Colace 100 mg by mouth twice a day  Senokot for constipation as directed and as needed Dulcolax (bisacodyl ), take with full glass of water   Miralax  (polyethylene glycol) once or twice a day as needed.  If you have tried all these things and are unable to have a bowel movement in the first 3-4 days after surgery call either your surgeon or your primary doctor.    If you experience loose stools or diarrhea, hold the medications until you stool forms back up.  If your symptoms do not get better within 1 week or if they get worse, check with your doctor.  If you experience "the worst abdominal pain ever" or develop nausea or vomiting, please contact the office immediately for further recommendations for treatment.  ITCHING:  If you experience itching with your medications, try taking only a single pain pill, or even half a pain pill at a time.  You can also use Benadryl  over the counter for itching or also to help with sleep.   TED HOSE STOCKINGS:  Use stockings on both legs until for at least 2 weeks or as directed by physician office. They may be removed at night for sleeping.  MEDICATIONS:  See your medication summary on the "After Visit Summary" that nursing will review with you.  You may have some home medications which will be placed on hold until you complete the course of blood thinner medication.  It is important for you to complete the blood thinner  medication as prescribed.  Blood clot prevention (DVT Prophylaxis): After surgery you are at an increased risk for a blood clot.  You were prescribed a blood thinner, Aspirin  81mg , to be taken twice daily for a total of 4 weeks from surgery to help reduce your risk of getting a blood clot.  Signs of a pulmonary embolus (blood clot in the lungs) include sudden short of breath, feeling lightheaded or dizzy, chest pain with a deep breath, rapid pulse rapid breathing.  Signs of a blood clot in your arms or legs include new unexplained swelling and cramping, warm, red or darkened skin around the painful area.  Please call the  office or 911 right away if these signs or symptoms develop.  PRECAUTIONS:   If you experience chest pain or shortness of breath - call 911 immediately for transfer to the hospital emergency department.   If you develop a fever greater that 101 F, purulent drainage from wound, increased redness or drainage from wound, foul odor from the wound/dressing, or calf pain - CONTACT YOUR SURGEON.                                                   FOLLOW-UP APPOINTMENTS:  If you do not already have a post-op appointment, please call the office for an appointment to be seen by your surgeon.  Guidelines for how soon to be seen are listed in your "After Visit Summary", but are typically between 2-3 weeks after surgery.  If you have a specialized bandage, you may be told to follow up 1 week after surgery.  OTHER INSTRUCTIONS:  Knee Replacement:  Do not place pillow under knee, focus on keeping the knee straight while resting.  Place foam block, curve side up under heel at all times except when walking.  DO NOT modify, tear, cut, or change the foam block in any way.  POST-OPERATIVE OPIOID TAPER INSTRUCTIONS: It is important to wean off of your opioid medication as soon as possible. If you do not need pain medication after your surgery it is ok to stop day one. Opioids include: Codeine,  Hydrocodone (Norco, Vicodin), Oxycodone(Percocet, oxycontin) and hydromorphone  amongst others.  Long term and even short term use of opiods can cause: Increased pain response Dependence Constipation Depression Respiratory depression And more.  Withdrawal symptoms can include Flu like symptoms Nausea, vomiting And more Techniques to manage these symptoms Hydrate well Eat regular healthy meals Stay active Use relaxation techniques(deep breathing, meditating, yoga) Do Not substitute Alcohol to help with tapering If you have been on opioids for less than two weeks and do not have pain than it is ok to stop all together.  Plan to wean off of opioids This plan should start within one week post op of your joint replacement. Maintain the same interval or time between taking each dose and first decrease the dose.  Cut the total daily intake of opioids by one tablet each day Next start to increase the time between doses. The last dose that should be eliminated is the evening dose.   MAKE SURE YOU:  Understand these instructions.  Get help right away if you are not doing well or get worse.    Thank you for letting us  be a part of your medical care team.  It is a privilege we respect greatly.  We hope these instructions will help you stay on track for a fast and full recovery!           Signed: Faustino Luecke A Honesti Seaberg 04/28/2024, 6:53 AM

## 2024-04-28 NOTE — Plan of Care (Signed)
   Problem: Coping: Goal: Level of anxiety will decrease Outcome: Progressing   Problem: Pain Managment: Goal: General experience of comfort will improve and/or be controlled Outcome: Progressing   Problem: Safety: Goal: Ability to remain free from injury will improve Outcome: Progressing

## 2024-04-28 NOTE — Progress Notes (Signed)
     Subjective:  Patient reports pain as mild.  Did well with PT yesterday. No issues overnight. Dnies distal n/t. Anticipate dc home today.  Objective:   VITALS:   Vitals:   04/27/24 1825 04/27/24 2147 04/28/24 0131 04/28/24 0554  BP: (!) 91/59 104/89 (!) 94/59 120/63  Pulse: 63 66 61 60  Resp: 16 17 17 17   Temp: 99.1 F (37.3 C) 98.3 F (36.8 C) 97.9 F (36.6 C) 98.3 F (36.8 C)  TempSrc:  Oral Oral Oral  SpO2: 95% 96% 96% 95%  Weight:      Height:        Sensation intact distally Intact pulses distally Dorsiflexion/Plantar flexion intact Incision: dressing C/D/I Compartment soft    Lab Results  Component Value Date   WBC 12.9 (H) 04/28/2024   HGB 9.9 (L) 04/28/2024   HCT 31.4 (L) 04/28/2024   MCV 93.5 04/28/2024   PLT 242 04/28/2024   BMET    Component Value Date/Time   NA 136 04/28/2024 0353   K 4.0 04/28/2024 0353   CL 103 04/28/2024 0353   CO2 25 04/28/2024 0353   GLUCOSE 112 (H) 04/28/2024 0353   BUN 14 04/28/2024 0353   CREATININE 0.64 04/28/2024 0353   CALCIUM 8.9 04/28/2024 0353   GFRNONAA >60 04/28/2024 0353   Xray: TKA components in good position, no adverse features  Assessment/Plan: 1 Day Post-Op   Principal Problem:   Primary osteoarthritis of left knee  S/p L TKA 5/21  Post op recs: WB: WBAT Abx: ancef  Imaging: PACU xrays DVT prophylaxis: Aspirin  81mg  BID x4 weeks Follow up: 2 weeks after surgery for a wound check with Dr. Pryor Browning at [facility].  Address: [address], [place], [place] [address]  Office Phone: [phone]    Murleen Arms 04/28/2024, 6:51 AM   Priscille Brought, MD  Contact information:   [phone] 7am-5pm epic message Dr. Pryor Browning, or call office for patient follow up: [phone] After hours and holidays please check [email or web address] for group call information for [facility]

## 2024-04-28 NOTE — Anesthesia Postprocedure Evaluation (Signed)
 Anesthesia Post Note  Patient: Sarah James  Procedure(s) Performed: ARTHROPLASTY, KNEE, TOTAL (Left: Knee)     Patient location during evaluation: PACU Anesthesia Type: MAC, Spinal and Regional Level of consciousness: oriented and awake and alert Pain management: pain level controlled Vital Signs Assessment: post-procedure vital signs reviewed and stable Respiratory status: spontaneous breathing, respiratory function stable and patient connected to nasal cannula oxygen Cardiovascular status: blood pressure returned to baseline and stable Postop Assessment: no headache, no backache and no apparent nausea or vomiting Anesthetic complications: no   No notable events documented.  Last Vitals:  Vitals:   04/28/24 0554 04/28/24 1025  BP: 120/63 105/65  Pulse: 60 65  Resp: 17 16  Temp: 36.8 C 36.7 C  SpO2: 95% 98%    Last Pain:  Vitals:   04/28/24 1140  TempSrc:   PainSc: 4                  Mahi Zabriskie S

## 2024-04-28 NOTE — TOC Transition Note (Signed)
 Transition of Care [facility]) - Discharge Note   Patient Details  Name: Sarah James MRN: [id number] Date of Birth: [birth year]  [facility] CM/SW Contact:  Delilah Fend, LCSW Phone Number: 04/28/2024, 10:55 AM   Clinical Narrative:     Met with pt who confirms she has needed DME in the home.  OPPT already arranged with [facility].  No further TOC needs.  Final next level of care: OP Rehab Barriers to Discharge: No Barriers Identified   Patient Goals and CMS Choice Patient states their goals for this hospitalization and ongoing recovery are:: return home          Discharge Placement                       Discharge Plan and Services Additional resources added to the After Visit Summary for                  DME Arranged: N/A DME Agency: NA                  Social Drivers of Health (SDOH) Interventions SDOH Screenings   Food Insecurity: No Food Insecurity (04/27/2024)  Housing: Low Risk  (04/27/2024)  Transportation Needs: No Transportation Needs (04/27/2024)  Utilities: Not At Risk (04/27/2024)  Depression (PHQ2-9): Low Risk  (10/21/2023)  Social Connections: Socially Isolated (04/27/2024)  Tobacco Use: Medium Risk (04/27/2024)     Readmission Risk Interventions     No data to display

## 2024-04-28 NOTE — Plan of Care (Signed)
Discharge instructions given to the patient including medications and follow ups.

## 2024-04-28 NOTE — Care Management Obs Status (Signed)
 MEDICARE OBSERVATION STATUS NOTIFICATION   Patient Details  Name: Sarah James MRN: [id number] Date of Birth: 09/27/1951   Medicare Observation Status Notification Given:  Yes  PATIENT DECLINED TO SIGN  Delilah Fend, LCSW 04/28/2024, 10:52 AM

## 2024-04-28 NOTE — Progress Notes (Signed)
 Physical Therapy Treatment Patient Details Name: Sarah James MRN: [id number] DOB: 1951/07/23 Today's Date: 04/28/2024   History of Present Illness 73 yo female presents to therapy s/p L TKA on 04/27/2024 due to failure of conservative measures. Pt PMH includes but is not limited to: R THA (2023), L THA,  R TKA, ADD, depression, anxiety and hypothyroidism.    PT Comments  POD # 1 am session Pt is AxO x 3 pleasant and motivated.  This is her fourth joint replacement.  Prior TKR and B THR. Assisted OOB to amb to bathroom.  General bed mobility comments: self able using belt to guide LE.  General transfer comment: good safety awareness and use of hands to steady self.  Also self performed a toilet transfer at Supervision level. General Gait Details: Pt tolerated a functional distance to the bathroom then in hallway. General stair comments: 25% VC's on proper tech using ONE rail both hands.  Then returned to room to perform some TE's following HEP handout.  Instructed on proper tech, freq as well as use of ICE.   Addressed all mobility questions, discussed appropriate activity, educated on use of ICE.  Pt ready for D/C to home.    If plan is discharge home, recommend the following: A little help with walking and/or transfers;A little help with bathing/dressing/bathroom;Assistance with cooking/housework;Assist for transportation;Help with stairs or ramp for entrance   Can travel by private vehicle        Equipment Recommendations  None recommended by PT    Recommendations for Other Services       Precautions / Restrictions Precautions Precaution/Restrictions Comments: no pillow under knee Restrictions Weight Bearing Restrictions Per Provider Order: No LLE Weight Bearing Per Provider Order: Weight bearing as tolerated     Mobility  Bed Mobility Overal bed mobility: Needs Assistance Bed Mobility: Supine to Sit     Supine to sit: Supervision, HOB elevated     General bed mobility  comments: self able using belt to guide LE    Transfers Overall transfer level: Needs assistance Equipment used: Rolling walker (2 wheels) Transfers: Sit to/from Stand Sit to Stand: Supervision           General transfer comment: good safety awareness and use of hands to steady self.  Also self performed a toilet transfer at Supervision level.    Ambulation/Gait Ambulation/Gait assistance: Supervision Gait Distance (Feet): 115 Feet Assistive device: Rolling walker (2 wheels) Gait Pattern/deviations: Step-to pattern, Antalgic, Trunk flexed, Decreased stance time - right Gait velocity: decreased     General Gait Details: Pt tolerated a functional distance to the bathroom then in hallway.   Stairs Stairs: Yes Stairs assistance: Supervision, Contact guard assist Stair Management: One rail Right, Step to pattern, Forwards, Sideways Number of Stairs: 3 General stair comments: 25% VC's on proper tech using ONE rail both hands   Wheelchair Mobility     Tilt Bed    Modified Rankin (Stroke Patients Only)       Balance                                            Communication Communication Communication: No apparent difficulties  Cognition Arousal: Alert Behavior During Therapy: WFL for tasks assessed/performed   PT - Cognitive impairments: No apparent impairments  PT - Cognition Comments: AxO x 3 pleasant and motivated Following commands: Intact      Cueing    Exercises  Total Knee Replacement TE's following HEP handout 10 reps B LE ankle pumps 05 reps towel squeezes 05 reps knee presses 05 reps heel slides  05 reps SAQ's 05 reps SLR's 05 reps ABD Educated on use of gait belt to assist with TE's Followed by ICE     General Comments        Pertinent Vitals/Pain Pain Assessment Pain Assessment: 0-10 Pain Location: L knee Pain Intervention(s): Monitored during session, Premedicated before session,  Repositioned, Ice applied    Home Living                          Prior Function            PT Goals (current goals can now be found in the care plan section) Progress towards PT goals: Progressing toward goals    Frequency    7X/week      PT Plan      Co-evaluation              AM-PAC PT "6 Clicks" Mobility   Outcome Measure  Help needed turning from your back to your side while in a flat bed without using bedrails?: None Help needed moving from lying on your back to sitting on the side of a flat bed without using bedrails?: None Help needed moving to and from a bed to a chair (including a wheelchair)?: None Help needed standing up from a chair using your arms (e.g., wheelchair or bedside chair)?: None Help needed to walk in hospital room?: None Help needed climbing 3-5 steps with a railing? : A Little 6 Click Score: 23    End of Session Equipment Utilized During Treatment: Gait belt Activity Tolerance: Patient tolerated treatment well;No increased pain Patient left: in chair;with call bell/phone within reach;with chair alarm set Nurse Communication: Mobility status PT Visit Diagnosis: Unsteadiness on feet (R26.81);Other abnormalities of gait and mobility (R26.89);Muscle weakness (generalized) (M62.81);Difficulty in walking, not elsewhere classified (R26.2);Pain Pain - Right/Left: Left Pain - part of body: Knee;Leg     Time: 7846-9629 PT Time Calculation (min) (ACUTE ONLY): 25 min  Charges:    $Gait Training: 8-22 mins $Therapeutic Exercise: 8-22 mins PT General Charges $$ ACUTE PT VISIT: 1 Visit                     Bess Broody  PTA Acute  Rehabilitation Services Office M-F          [phone]

## 2024-05-05 DIAGNOSIS — R269 Unspecified abnormalities of gait and mobility: Secondary | ICD-10-CM | POA: Diagnosis not present

## 2024-05-05 DIAGNOSIS — M25562 Pain in left knee: Secondary | ICD-10-CM | POA: Diagnosis not present

## 2024-05-09 DIAGNOSIS — D649 Anemia, unspecified: Secondary | ICD-10-CM | POA: Diagnosis not present

## 2024-05-09 DIAGNOSIS — Z79899 Other long term (current) drug therapy: Secondary | ICD-10-CM | POA: Diagnosis not present

## 2024-05-09 DIAGNOSIS — R269 Unspecified abnormalities of gait and mobility: Secondary | ICD-10-CM | POA: Diagnosis not present

## 2024-05-09 DIAGNOSIS — E039 Hypothyroidism, unspecified: Secondary | ICD-10-CM | POA: Diagnosis not present

## 2024-05-09 DIAGNOSIS — R7301 Impaired fasting glucose: Secondary | ICD-10-CM | POA: Diagnosis not present

## 2024-05-09 DIAGNOSIS — M25562 Pain in left knee: Secondary | ICD-10-CM | POA: Diagnosis not present

## 2024-05-11 DIAGNOSIS — M25562 Pain in left knee: Secondary | ICD-10-CM | POA: Diagnosis not present

## 2024-05-11 DIAGNOSIS — R269 Unspecified abnormalities of gait and mobility: Secondary | ICD-10-CM | POA: Diagnosis not present

## 2024-05-12 DIAGNOSIS — M1712 Unilateral primary osteoarthritis, left knee: Secondary | ICD-10-CM | POA: Diagnosis not present

## 2024-05-13 DIAGNOSIS — R269 Unspecified abnormalities of gait and mobility: Secondary | ICD-10-CM | POA: Diagnosis not present

## 2024-05-13 DIAGNOSIS — M25562 Pain in left knee: Secondary | ICD-10-CM | POA: Diagnosis not present

## 2024-05-16 DIAGNOSIS — M25562 Pain in left knee: Secondary | ICD-10-CM | POA: Diagnosis not present

## 2024-05-16 DIAGNOSIS — R269 Unspecified abnormalities of gait and mobility: Secondary | ICD-10-CM | POA: Diagnosis not present

## 2024-05-18 DIAGNOSIS — M25562 Pain in left knee: Secondary | ICD-10-CM | POA: Diagnosis not present

## 2024-05-18 DIAGNOSIS — R269 Unspecified abnormalities of gait and mobility: Secondary | ICD-10-CM | POA: Diagnosis not present

## 2024-05-19 DIAGNOSIS — M25562 Pain in left knee: Secondary | ICD-10-CM | POA: Diagnosis not present

## 2024-05-19 DIAGNOSIS — R269 Unspecified abnormalities of gait and mobility: Secondary | ICD-10-CM | POA: Diagnosis not present

## 2024-05-23 DIAGNOSIS — R269 Unspecified abnormalities of gait and mobility: Secondary | ICD-10-CM | POA: Diagnosis not present

## 2024-05-23 DIAGNOSIS — M25562 Pain in left knee: Secondary | ICD-10-CM | POA: Diagnosis not present

## 2024-05-26 DIAGNOSIS — M25562 Pain in left knee: Secondary | ICD-10-CM | POA: Diagnosis not present

## 2024-05-26 DIAGNOSIS — R269 Unspecified abnormalities of gait and mobility: Secondary | ICD-10-CM | POA: Diagnosis not present

## 2024-05-30 DIAGNOSIS — R269 Unspecified abnormalities of gait and mobility: Secondary | ICD-10-CM | POA: Diagnosis not present

## 2024-05-30 DIAGNOSIS — M25562 Pain in left knee: Secondary | ICD-10-CM | POA: Diagnosis not present

## 2024-06-02 DIAGNOSIS — M25562 Pain in left knee: Secondary | ICD-10-CM | POA: Diagnosis not present

## 2024-06-02 DIAGNOSIS — R269 Unspecified abnormalities of gait and mobility: Secondary | ICD-10-CM | POA: Diagnosis not present

## 2024-06-06 DIAGNOSIS — R269 Unspecified abnormalities of gait and mobility: Secondary | ICD-10-CM | POA: Diagnosis not present

## 2024-06-06 DIAGNOSIS — M25562 Pain in left knee: Secondary | ICD-10-CM | POA: Diagnosis not present

## 2024-06-08 DIAGNOSIS — M25562 Pain in left knee: Secondary | ICD-10-CM | POA: Diagnosis not present

## 2024-06-08 DIAGNOSIS — R269 Unspecified abnormalities of gait and mobility: Secondary | ICD-10-CM | POA: Diagnosis not present

## 2024-06-09 DIAGNOSIS — M1712 Unilateral primary osteoarthritis, left knee: Secondary | ICD-10-CM | POA: Diagnosis not present

## 2024-06-13 DIAGNOSIS — R269 Unspecified abnormalities of gait and mobility: Secondary | ICD-10-CM | POA: Diagnosis not present

## 2024-06-13 DIAGNOSIS — M25562 Pain in left knee: Secondary | ICD-10-CM | POA: Diagnosis not present

## 2024-06-16 DIAGNOSIS — R269 Unspecified abnormalities of gait and mobility: Secondary | ICD-10-CM | POA: Diagnosis not present

## 2024-06-16 DIAGNOSIS — M25562 Pain in left knee: Secondary | ICD-10-CM | POA: Diagnosis not present

## 2024-06-20 DIAGNOSIS — M25562 Pain in left knee: Secondary | ICD-10-CM | POA: Diagnosis not present

## 2024-06-20 DIAGNOSIS — R269 Unspecified abnormalities of gait and mobility: Secondary | ICD-10-CM | POA: Diagnosis not present

## 2024-06-29 ENCOUNTER — Ambulatory Visit (HOSPITAL_BASED_OUTPATIENT_CLINIC_OR_DEPARTMENT_OTHER)

## 2024-06-29 ENCOUNTER — Encounter (HOSPITAL_BASED_OUTPATIENT_CLINIC_OR_DEPARTMENT_OTHER): Payer: Self-pay

## 2024-06-29 VITALS — BP 104/76 | HR 83 | Wt 166.2 lb

## 2024-06-29 DIAGNOSIS — R3915 Urgency of urination: Secondary | ICD-10-CM | POA: Diagnosis not present

## 2024-06-29 LAB — POCT URINALYSIS DIPSTICK
Bilirubin, UA: NEGATIVE
Blood, UA: NEGATIVE
Glucose, UA: NEGATIVE
Ketones, UA: NEGATIVE
Leukocytes, UA: NEGATIVE
Nitrite, UA: NEGATIVE
Protein, UA: NEGATIVE
Spec Grav, UA: 1.02 (ref 1.010–1.025)
Urobilinogen, UA: 0.2 U/dL
pH, UA: 5.5 (ref 5.0–8.0)

## 2024-06-29 NOTE — Progress Notes (Signed)
 NURSE VISIT- UTI SYMPTOMS   SUBJECTIVE:  Sarah James is a 73 y.o. G21P2004 female here for UTI symptoms. She is a GYN patient. She reports urinary urgency.  OBJECTIVE:  BP 104/76   Pulse 83   Wt 166 lb 3.2 oz (75.4 kg)   LMP 12/08/2002   BMI 28.53 kg/m   Appears well, in no apparent distress  No results found for this or any previous visit (from the past 24 hours).  ASSESSMENT: GYN patient with UTI symptoms and negative nitrites  PLAN: Visit routed to or discussed with:  Rx sent today: No Urine culture sent Call or return to clinic prn if these symptoms worsen or fail to improve as anticipated. Follow-up: as needed

## 2024-06-30 DIAGNOSIS — R269 Unspecified abnormalities of gait and mobility: Secondary | ICD-10-CM | POA: Diagnosis not present

## 2024-06-30 DIAGNOSIS — M25562 Pain in left knee: Secondary | ICD-10-CM | POA: Diagnosis not present

## 2024-07-01 ENCOUNTER — Ambulatory Visit (HOSPITAL_BASED_OUTPATIENT_CLINIC_OR_DEPARTMENT_OTHER): Payer: Self-pay | Admitting: Obstetrics & Gynecology

## 2024-07-01 LAB — URINE CULTURE

## 2024-07-14 DIAGNOSIS — R269 Unspecified abnormalities of gait and mobility: Secondary | ICD-10-CM | POA: Diagnosis not present

## 2024-07-14 DIAGNOSIS — M25562 Pain in left knee: Secondary | ICD-10-CM | POA: Diagnosis not present

## 2024-07-21 DIAGNOSIS — M25562 Pain in left knee: Secondary | ICD-10-CM | POA: Diagnosis not present

## 2024-07-21 DIAGNOSIS — R269 Unspecified abnormalities of gait and mobility: Secondary | ICD-10-CM | POA: Diagnosis not present

## 2024-09-08 DIAGNOSIS — X58XXXA Exposure to other specified factors, initial encounter: Secondary | ICD-10-CM | POA: Diagnosis not present

## 2024-09-08 DIAGNOSIS — S81811A Laceration without foreign body, right lower leg, initial encounter: Secondary | ICD-10-CM | POA: Diagnosis not present

## 2024-09-08 DIAGNOSIS — Z23 Encounter for immunization: Secondary | ICD-10-CM | POA: Diagnosis not present

## 2024-09-08 DIAGNOSIS — L03115 Cellulitis of right lower limb: Secondary | ICD-10-CM | POA: Diagnosis not present
